# Patient Record
Sex: Female | Born: 1965 | Race: White | Hispanic: No | Marital: Married | Smoking: Former smoker
Health system: Southern US, Community
[De-identification: ages and names within clinical notes are randomized; demographics above are authoritative.]

## PROBLEM LIST (undated history)

## (undated) DIAGNOSIS — F419 Anxiety disorder, unspecified: Secondary | ICD-10-CM

## (undated) DIAGNOSIS — R011 Cardiac murmur, unspecified: Secondary | ICD-10-CM

## (undated) DIAGNOSIS — K589 Irritable bowel syndrome without diarrhea: Secondary | ICD-10-CM

## (undated) DIAGNOSIS — E119 Type 2 diabetes mellitus without complications: Secondary | ICD-10-CM

## (undated) DIAGNOSIS — I10 Essential (primary) hypertension: Secondary | ICD-10-CM

## (undated) DIAGNOSIS — J189 Pneumonia, unspecified organism: Secondary | ICD-10-CM

## (undated) DIAGNOSIS — C539 Malignant neoplasm of cervix uteri, unspecified: Secondary | ICD-10-CM

## (undated) DIAGNOSIS — E785 Hyperlipidemia, unspecified: Secondary | ICD-10-CM

## (undated) DIAGNOSIS — G35 Multiple sclerosis: Secondary | ICD-10-CM

## (undated) DIAGNOSIS — Z8719 Personal history of other diseases of the digestive system: Secondary | ICD-10-CM

## (undated) DIAGNOSIS — K219 Gastro-esophageal reflux disease without esophagitis: Secondary | ICD-10-CM

## (undated) DIAGNOSIS — K5792 Diverticulitis of intestine, part unspecified, without perforation or abscess without bleeding: Secondary | ICD-10-CM

## (undated) DIAGNOSIS — Z9889 Other specified postprocedural states: Secondary | ICD-10-CM

## (undated) DIAGNOSIS — I499 Cardiac arrhythmia, unspecified: Secondary | ICD-10-CM

## (undated) HISTORY — DX: Cardiac murmur, unspecified: R01.1

## (undated) HISTORY — PX: APPENDECTOMY: SHX54

## (undated) HISTORY — PX: OTHER SURGICAL HISTORY: SHX169

## (undated) HISTORY — DX: Multiple sclerosis: G35

## (undated) HISTORY — DX: Personal history of other diseases of the digestive system: Z87.19

## (undated) HISTORY — DX: Malignant neoplasm of cervix uteri, unspecified: C53.9

## (undated) HISTORY — DX: Pneumonia, unspecified organism: J18.9

## (undated) HISTORY — DX: Type 2 diabetes mellitus without complications: E11.9

## (undated) HISTORY — DX: Other specified postprocedural states: Z98.890

## (undated) HISTORY — DX: Irritable bowel syndrome, unspecified: K58.9

## (undated) HISTORY — DX: Anxiety disorder, unspecified: F41.9

## (undated) HISTORY — DX: Diverticulitis of intestine, part unspecified, without perforation or abscess without bleeding: K57.92

## (undated) HISTORY — DX: Hyperlipidemia, unspecified: E78.5

## (undated) HISTORY — DX: Gastro-esophageal reflux disease without esophagitis: K21.9

---

## 1990-06-06 DIAGNOSIS — C539 Malignant neoplasm of cervix uteri, unspecified: Secondary | ICD-10-CM

## 1990-06-06 HISTORY — DX: Malignant neoplasm of cervix uteri, unspecified: C53.9

## 2006-06-06 DIAGNOSIS — J189 Pneumonia, unspecified organism: Secondary | ICD-10-CM

## 2006-06-06 HISTORY — DX: Pneumonia, unspecified organism: J18.9

## 2010-04-15 ENCOUNTER — Emergency Department: Payer: Self-pay | Admitting: Emergency Medicine

## 2010-07-26 ENCOUNTER — Emergency Department (HOSPITAL_COMMUNITY)
Admission: EM | Admit: 2010-07-26 | Discharge: 2010-07-26 | Disposition: A | Discharge status: Home / Self Care | Payer: Self-pay | Attending: Emergency Medicine | Admitting: Emergency Medicine

## 2010-07-26 ENCOUNTER — Emergency Department (HOSPITAL_COMMUNITY): Payer: Self-pay

## 2010-07-26 DIAGNOSIS — S8010XA Contusion of unspecified lower leg, initial encounter: Secondary | ICD-10-CM | POA: Insufficient documentation

## 2010-07-26 DIAGNOSIS — M79609 Pain in unspecified limb: Secondary | ICD-10-CM | POA: Insufficient documentation

## 2010-07-26 DIAGNOSIS — R110 Nausea: Secondary | ICD-10-CM | POA: Insufficient documentation

## 2010-07-26 DIAGNOSIS — R209 Unspecified disturbances of skin sensation: Secondary | ICD-10-CM | POA: Insufficient documentation

## 2010-07-26 DIAGNOSIS — R42 Dizziness and giddiness: Secondary | ICD-10-CM | POA: Insufficient documentation

## 2010-07-26 DIAGNOSIS — M25439 Effusion, unspecified wrist: Secondary | ICD-10-CM | POA: Insufficient documentation

## 2010-07-26 DIAGNOSIS — S63509A Unspecified sprain of unspecified wrist, initial encounter: Secondary | ICD-10-CM | POA: Insufficient documentation

## 2010-07-26 DIAGNOSIS — W010XXA Fall on same level from slipping, tripping and stumbling without subsequent striking against object, initial encounter: Secondary | ICD-10-CM | POA: Insufficient documentation

## 2010-07-26 DIAGNOSIS — Y929 Unspecified place or not applicable: Secondary | ICD-10-CM | POA: Insufficient documentation

## 2011-02-11 ENCOUNTER — Other Ambulatory Visit: Payer: Self-pay | Admitting: Family Medicine

## 2011-02-11 DIAGNOSIS — Z1231 Encounter for screening mammogram for malignant neoplasm of breast: Secondary | ICD-10-CM

## 2011-02-16 ENCOUNTER — Ambulatory Visit: Payer: Self-pay

## 2011-03-16 ENCOUNTER — Ambulatory Visit: Payer: Self-pay

## 2011-05-11 ENCOUNTER — Other Ambulatory Visit (HOSPITAL_COMMUNITY): Payer: Self-pay | Admitting: *Deleted

## 2011-06-10 ENCOUNTER — Ambulatory Visit: Payer: Self-pay

## 2011-09-23 ENCOUNTER — Emergency Department: Payer: Self-pay | Admitting: *Deleted

## 2012-06-26 ENCOUNTER — Emergency Department (HOSPITAL_COMMUNITY)
Admission: EM | Admit: 2012-06-26 | Discharge: 2012-06-26 | Disposition: A | Discharge status: Home / Self Care | Payer: Self-pay | Attending: Emergency Medicine | Admitting: Emergency Medicine

## 2012-06-26 ENCOUNTER — Emergency Department (HOSPITAL_COMMUNITY): Payer: Self-pay

## 2012-06-26 ENCOUNTER — Encounter (HOSPITAL_COMMUNITY): Payer: Self-pay | Admitting: *Deleted

## 2012-06-26 DIAGNOSIS — M25519 Pain in unspecified shoulder: Secondary | ICD-10-CM | POA: Insufficient documentation

## 2012-06-26 DIAGNOSIS — M25512 Pain in left shoulder: Secondary | ICD-10-CM

## 2012-06-26 DIAGNOSIS — I10 Essential (primary) hypertension: Secondary | ICD-10-CM | POA: Insufficient documentation

## 2012-06-26 DIAGNOSIS — Z8679 Personal history of other diseases of the circulatory system: Secondary | ICD-10-CM | POA: Insufficient documentation

## 2012-06-26 HISTORY — DX: Cardiac arrhythmia, unspecified: I49.9

## 2012-06-26 HISTORY — DX: Essential (primary) hypertension: I10

## 2012-06-26 MED ORDER — NAPROXEN 500 MG PO TABS: 500.0000 mg | ORAL_TABLET | Freq: Two times a day (BID) | ORAL | Status: DC

## 2012-06-26 MED ORDER — HYDROCODONE-ACETAMINOPHEN 5-325 MG PO TABS: 2.0000 | ORAL_TABLET | Freq: Four times a day (QID) | ORAL | Status: DC | PRN

## 2012-06-26 MED ORDER — IBUPROFEN 800 MG PO TABS
800.0000 mg | ORAL_TABLET | Freq: Once | ORAL | Status: AC
  Administered 2012-06-26: 800 mg via ORAL
  Filled 2012-06-26: qty 1

## 2012-06-26 NOTE — ED Provider Notes (Signed)
History   This chart was scribed for non-physician practitioner working with Raeford Razor, MD by Smitty Pluck. This patient was seen in room TR04C/TR04C and the patient's care was started at 8:57 PM.   CSN: [id number]  Arrival date & time 06/26/12  4098      Chief Complaint  Patient presents with  . Shoulder Pain    (Consider location/radiation/quality/duration/timing/severity/associated sxs/prior treatment) Patient is a 47 y.o. female presenting with shoulder pain. The history is provided by the patient. No language interpreter was used.  Shoulder Pain This is a chronic problem. The current episode started more than 1 week ago. The problem occurs constantly. The problem has not changed since onset.Pertinent negatives include no shortness of breath. Nothing relieves the symptoms. She has tried nothing for the symptoms.   Stephanie Webb is a 47 y.o. female who presents to the Emergency Department complaining of moderate left shoulder pain that has been ongoing since September 2013. Pt reports that she fell on her left shoulder in April 2013 but did not have moderate pain until September 2013. Pt reports that she has seen orthopedist for same symptoms and was told she did not have a dislocation or fracture. She reports that she is unable to raise her left arm over her head and that movement of left shoulder aggravates the pain. Pt denies fever, chills, nausea, vomiting, diarrhea, weakness, cough, SOB and any other pain.   Orthopedist in [place]  Past Medical History  Diagnosis Date  . Hypertension   . Irregular heart beat     History reviewed. No pertinent past surgical history.  History reviewed. No pertinent family history.  History  Substance Use Topics  . Smoking status: Never Smoker   . Smokeless tobacco: Not on file  . Alcohol Use: No    OB History    Grav Para Term Preterm Abortions TAB SAB Ect Mult Living                  Review of Systems  Constitutional:  Negative for fever and chills.  Respiratory: Negative for cough and shortness of breath.   Gastrointestinal: Negative for nausea, vomiting and diarrhea.  Musculoskeletal: Positive for arthralgias.  Neurological: Negative for weakness.  All other systems reviewed and are negative.    Allergies  Review of patient's allergies indicates no known allergies.  Home Medications   Current Outpatient Rx  Name  Route  Sig  Dispense  Refill  . METOPROLOL SUCCINATE ER 50 MG PO TB24   Oral   Take 50 mg by mouth every evening. Take with or immediately following a meal.           BP 140/92  Pulse 92  Temp 98 F (36.7 C) (Oral)  Resp 16  SpO2 98%  LMP 06/15/2012  Physical Exam  Nursing note and vitals reviewed. Constitutional: She is oriented to person, place, and time. She appears well-developed and well-nourished. No distress.  HENT:  Head: Normocephalic and atraumatic.  Eyes: Conjunctivae normal and EOM are normal.  Neck: Neck supple. No tracheal deviation present.  Cardiovascular: Normal rate, regular rhythm, normal heart sounds and intact distal pulses.   Pulmonary/Chest: Effort normal and breath sounds normal. No respiratory distress. She has no wheezes.  Musculoskeletal:       Limited ROM of left shoulder Left shoulder is tender to palpation Sensation of left arm intact distally   Neurological: She is alert and oriented to person, place, and time.  Skin: Skin is warm and  dry.  Psychiatric: She has a normal mood and affect. Her behavior is normal.    ED Course  Procedures (including critical care time) DIAGNOSTIC STUDIES: Oxygen Saturation is 98% on room air, normal by my interpretation.    COORDINATION OF CARE: 9:02 PM Discussed ED treatment with pt and pt agrees.     Labs Reviewed - No data to display Dg Shoulder Left  06/26/2012  *RADIOLOGY REPORT*  Clinical Data: Status post fall 8 months ago.  Shoulder pain for 4 months.  Limited range of motion.  LEFT SHOULDER  - 2+ VIEW  Comparison: None.  Findings: The humerus is located and the acromioclavicular joint is intact.  There is no notable degenerative disease about the shoulder.  Imaged left lung and ribs are unremarkable.  IMPRESSION: Negative exam.   Original Report Authenticated By: Holley Dexter, M.D.      No diagnosis found.  Patient with inability to extend arm actively or passively beyond ninety degrees without significant pain.  Radiology results from today reviewed and shared with patient.  Suspect frozen shoulder.  Placed in sling, orthopedic referral.  MDM     I personally performed the services described in this documentation, which was scribed in my presence. The recorded information has been reviewed and is accurate.      Jimmye Norman, NP 06/26/12 2351

## 2012-06-26 NOTE — ED Notes (Signed)
Pt states that she fell in April, was seen at [facility] and then since September she has been having decreased mobility and burning sensations. Pt states she is also hearing popping when she attempts to move her shoulder and then she can move a little further. Pt states she can't sleep, her arm goes numb, pt states also painful. Pt last x ray was in April. Pt able to lift arm straight in front of her and to the side half way up, pt has not had her rotator cuff checked. Pt states it is pen point specific pain in different areas when she moves. Pt cap refill less than 3, pt able to wiggle fingers.

## 2012-06-26 NOTE — Progress Notes (Signed)
Orthopedic Tech Progress Note Patient Details:  Stephanie Webb Oct 20, 1965 [id number]  Ortho Devices Type of Ortho Device: Arm sling Ortho Device/Splint Location: (L) UE Ortho Device/Splint Interventions: Application   [name] 06/26/2012, 9:06 PM

## 2012-06-29 NOTE — ED Provider Notes (Signed)
Medical screening examination/treatment/procedure(s) were performed by non-physician practitioner and as supervising physician I was immediately available for consultation/collaboration.  Shamir Sedlar, MD 06/29/12 0740 

## 2013-08-27 ENCOUNTER — Ambulatory Visit (INDEPENDENT_AMBULATORY_CARE_PROVIDER_SITE_OTHER): Payer: 59 | Admitting: Neurology

## 2013-08-27 ENCOUNTER — Encounter: Payer: Self-pay | Admitting: Neurology

## 2013-08-27 VITALS — BP 128/84 | HR 84 | Ht 65.35 in | Wt 227.0 lb

## 2013-08-27 DIAGNOSIS — R209 Unspecified disturbances of skin sensation: Secondary | ICD-10-CM

## 2013-08-27 DIAGNOSIS — R51 Headache: Secondary | ICD-10-CM

## 2013-08-27 DIAGNOSIS — R5383 Other fatigue: Secondary | ICD-10-CM

## 2013-08-27 DIAGNOSIS — H539 Unspecified visual disturbance: Secondary | ICD-10-CM

## 2013-08-27 DIAGNOSIS — R5381 Other malaise: Secondary | ICD-10-CM

## 2013-08-27 DIAGNOSIS — R202 Paresthesia of skin: Secondary | ICD-10-CM

## 2013-08-27 NOTE — Progress Notes (Signed)
[facility] Clinic Note - Initial Visit   Date: 08/27/2013    Chrystal Zeimet MRN: [id number] DOB: 1965/06/07   Dear Dr Tobin Chad:  Thank you for your kind referral of Amily Depp for consultation of numbness/tingling of the several neurological complaints. Although her history is well known to you, please allow [place] to reiterate it for the purpose of our medical record. The patient was accompanied to the clinic by self.   History of Present Illness: Stephanie Webb is a 48 y.o. right-handed Caucasian female with history of hypertension, GERD, irritable bowel syndrome, and palpitations presenting for evaluation of vision changes, fatigue, and generalized numbness/tingling.    She feels that she has multiple sclerosis because she complains of 20-year history of transient bilateral vision loss, double vision, generalized fatigue, numbness/tingling of the arms > legs, and muscle aches.  All of her symptoms are fluctuating and occur for several days at a time about 3-4 times per year. She does not feel depressed.  In July 2014, she says she had an episode of bilateral leg numbness and was unable to lift her legs.  She did not go to the ED, but massaged her legs which resolved symptoms.  She denies any provoking factors or alleviates. She reports having dilated eye exams which has been normal.  She also reports having unrefreshed sleep, mild snoring, and waking up several times at night.     Out-side paper records, electronic medical record, and images have been reviewed where available and summarized as:  Labs 08/08/2013:  WBC 7.7, Hgb 14.2, PLT 220, Na 140, K 4.6, Cr 0.66, AST 25, ALT 43*, TSH 1.6, ESR 3, vitamin B12 553   Past Medical History  Diagnosis Date  . Hypertension   . Irregular heart beat   . GERD (gastroesophageal reflux disease)     Past Surgical History  Procedure Laterality Date  . Cesarean section    . Ovary removal       Medications:    Current Outpatient Prescriptions on File Prior to Visit  Medication Sig Dispense Refill  . metoprolol succinate (TOPROL-XL) 50 MG 24 hr tablet Take 50 mg by mouth every evening. Take with or immediately following a meal.       No current facility-administered medications on file prior to visit.    Allergies: No Known Allergies  Family History: Family History  Problem Relation Age of Onset  . Hypertension Mother   . Heart disease Maternal Grandfather   . Cancer Father     Deceased  . Healthy Brother     Social History: History   Social History  . Marital Status: Legally Separated    Spouse Name: N/A    Number of Children: 4  . Years of Education: N/A   Occupational History  .  [employer]   Social History Main Topics  . Smoking status: Never Smoker   . Smokeless tobacco: Not on file  . Alcohol Use: Yes     Comment: Occasional   . 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 Sexual Activity: Not on file   Other Topics Concern  . Not on file   Social History Narrative   She works at [employer].   She lives with husband and two children. She has two grown children living outside of the home.   Highest level of education:  Associates degree    Review of Systems:  CONSTITUTIONAL: No  fevers, chills, night sweats, or weight loss.   EYES: + visual changes or eye pain ENT: No hearing changes.  No history of nose bleeds.   RESPIRATORY: No cough, wheezing and shortness of breath.   CARDIOVASCULAR: Negative for chest pain, and palpitations.   GI: Negative for abdominal discomfort, blood in stools or black stools.  +recent change in bowel habits.   GU:  No history of incontinence.   MUSCLOSKELETAL: No history of joint pain or swelling.  +myalgias.   SKIN: Negative for lesions, rash, and itching.   HEMATOLOGY/ONCOLOGY: Negative for prolonged bleeding, bruising easily, and swollen nodes.  No history of cancer.   ENDOCRINE:  Negative for cold or heat intolerance, polydipsia or goiter.   PSYCH:  No depression or anxiety symptoms.   NEURO: As Above.   Vital Signs:  BP 128/84  Pulse 84  Ht 5' 5.35" (1.66 m)  Wt 227 lb (102.967 kg)  BMI 37.37 kg/m2  SpO2 95% Pain Scale: 0 on a scale of 0-10   General Medical Exam:   General:  Well appearing, comfortable.   Eyes/ENT: see cranial nerve examination.   Neck: No masses appreciated.  Full range of motion without tenderness.  No carotid bruits. Respiratory:  Clear to auscultation, good air entry bilaterally.   Cardiac:  Regular rate and rhythm, no murmur.   Back:  No pain to palpation of spinous processes.   Extremities:  No deformities, edema, or skin discoloration. Good capillary refill.   Skin:  Skin color, texture, turgor normal. No rashes or lesions.  Neurological Exam: MENTAL STATUS including orientation to time, place, person, recent and remote memory, attention span and concentration, language, and fund of knowledge is normal.  Speech is not dysarthric.  CRANIAL NERVES: II:  No visual field defects.  Unremarkable fundi.   III-IV-VI: Pupils equal round and reactive to light.  Normal conjugate, extra-ocular eye movements in all directions of gaze.  No nystagmus.  No ptosis.   V:  Normal facial sensation.  Jaw jerk is absent  VII:  Normal facial symmetry and movements.  No pathologic facial reflexes.  VIII:  Normal hearing and vestibular function.   IX-X:  Normal palatal movement.   XI:  Normal shoulder shrug and head rotation.   XII:  Normal tongue strength and range of motion, no deviation or fasciculation.  MOTOR:  No atrophy, fasciculations or abnormal movements.  No pronator drift.  Tone is normal.    Right Upper Extremity:    Left Upper Extremity:    Deltoid  5/5   Deltoid  5/5   Biceps  5/5   Biceps  5/5   Triceps  5/5   Triceps  5/5   Wrist extensors  5/5   Wrist extensors  5/5   Wrist flexors  5/5   Wrist flexors  5/5   Finger extensors   5/5   Finger extensors  5/5   Finger flexors  5/5   Finger flexors  5/5   Dorsal interossei  5/5   Dorsal interossei  5/5   Abductor pollicis  5/5   Abductor pollicis  5/5   Tone (Ashworth scale)  0  Tone (Ashworth scale)  0   Right Lower Extremity:    Left Lower Extremity:    Hip flexors  5/5   Hip flexors  5/5   Hip extensors  5/5   Hip extensors  5/5   Knee flexors  5/5   Knee flexors  5/5   Knee extensors  5/5   Knee extensors  5/5   Dorsiflexors  5/5   Dorsiflexors  5/5   Plantarflexors  5/5   Plantarflexors  5/5   Toe extensors  5/5   Toe extensors  5/5   Toe flexors  5/5   Toe flexors  5/5   Tone (Ashworth scale)  0  Tone (Ashworth scale)  0   MSRs:  Right                                                                 Left brachioradialis 2+  brachioradialis 2+  biceps 2+  biceps 2+  triceps 2+  triceps 2+  patellar 2+  patellar 2+  ankle jerk 2+  ankle jerk 2+  Hoffman no  Hoffman no  plantar response down  plantar response down   SENSORY:  Normal and symmetric perception of light touch, pinprick, vibration, and proprioception.  Romberg's sign absent.   COORDINATION/GAIT: Normal finger-to- nose-finger and heel-to-shin.  Intact rapid alternating movements bilaterally.  Able to rise from a chair without using arms.  Gait narrow based and stable. Tandem and stressed gait intact.    IMPRESSION/PLAN: Ms. Omura is a 48 year-old female presenting with a multitude of complaints (paresthesias, vision changes, ringing in the ear, myalgias, generalized fatigue and highly concerned that she has multiple sclerosis.  I ressured her that given her normal exam, my suspicion for demyelinating disease is very low. For completeness, MRI brain will be checked.  Her labs including TSH and vitamin B12 are normal.  I will also check ANA.  She reports having fragmented sleep patterns and I wonder if some of her fatigue is coming from poor sleep habits and/or OSA but it would not necessarily  explain her other somatic symptoms.  Sleep study can be considered going forward, if results of imaging are normal  I will see her back in 1-16months.   The duration of this appointment visit was 45 minutes of face-to-face time with the patient.  Greater than 50% of this time was spent in counseling, explanation of diagnosis, planning of further management, and coordination of care.   Thank you for allowing me to participate in patient's care.  If I can answer any additional questions, I would be pleased to do so.    Sincerely,    Shahida Schnackenberg K. Posey Pronto, DO

## 2013-08-28 DIAGNOSIS — R5381 Other malaise: Secondary | ICD-10-CM | POA: Insufficient documentation

## 2013-08-28 DIAGNOSIS — R5383 Other fatigue: Secondary | ICD-10-CM

## 2013-08-28 DIAGNOSIS — R202 Paresthesia of skin: Secondary | ICD-10-CM | POA: Insufficient documentation

## 2013-08-28 LAB — ANA: Anti Nuclear Antibody(ANA): NEGATIVE

## 2013-09-03 ENCOUNTER — Other Ambulatory Visit: Payer: 59

## 2013-09-05 ENCOUNTER — Ambulatory Visit
Admission: RE | Admit: 2013-09-05 | Discharge: 2013-09-05 | Disposition: A | Discharge status: Home / Self Care | Payer: Medicaid Other | Source: Ambulatory Visit | Attending: Neurology | Admitting: Neurology

## 2013-09-05 ENCOUNTER — Inpatient Hospital Stay: Admission: RE | Admit: 2013-09-05 | Payer: 59 | Source: Ambulatory Visit

## 2013-09-05 DIAGNOSIS — R51 Headache: Secondary | ICD-10-CM

## 2013-09-05 DIAGNOSIS — R202 Paresthesia of skin: Secondary | ICD-10-CM

## 2013-09-05 DIAGNOSIS — R5381 Other malaise: Secondary | ICD-10-CM

## 2013-09-05 DIAGNOSIS — H539 Unspecified visual disturbance: Secondary | ICD-10-CM

## 2013-09-05 MED ORDER — GADOBENATE DIMEGLUMINE 529 MG/ML IV SOLN
20.0000 mL | Freq: Once | INTRAVENOUS | Status: AC | PRN
  Administered 2013-09-05: 20 mL via INTRAVENOUS

## 2013-09-09 ENCOUNTER — Other Ambulatory Visit: Payer: Self-pay | Admitting: *Deleted

## 2013-09-09 ENCOUNTER — Telehealth: Payer: Self-pay | Admitting: Neurology

## 2013-09-09 DIAGNOSIS — G379 Demyelinating disease of central nervous system, unspecified: Secondary | ICD-10-CM

## 2013-09-09 DIAGNOSIS — R9089 Other abnormal findings on diagnostic imaging of central nervous system: Secondary | ICD-10-CM

## 2013-09-09 DIAGNOSIS — IMO0002 Reserved for concepts with insufficient information to code with codable children: Secondary | ICD-10-CM

## 2013-09-09 NOTE — Telephone Encounter (Signed)
Received a call from patient to discuss MRI results. There is evidence of white matter changes involving the cortical and subcortical region which can be seen in demyelinating disease. I would like to followup with an MRI of the cervical spine to determine disease burden. I discussed going forward, lumbar puncture may be needed for more definitive diagnosis. There is no areas of enhancement suggesting active disease. All questions were answered to the best of my ability.  Stephanie Arbuthnot K. Posey Pronto, DO

## 2013-09-09 NOTE — Telephone Encounter (Signed)
I attempted to contact patient via phone today regarding the results of MRI brain, however there was no answer and there was no option to leave a voicemail. I tried emergency contact on file, her daughter, who also did not answer so message was left to ask her mother to return my call.  Alija Riano K. Posey Pronto, DO

## 2013-09-09 NOTE — Telephone Encounter (Signed)
MRI scheduled for Monday, April 13th at 6:45 pm.  Patient notified of appt time and date.

## 2013-09-13 ENCOUNTER — Ambulatory Visit
Admission: RE | Admit: 2013-09-13 | Discharge: 2013-09-13 | Disposition: A | Discharge status: Home / Self Care | Payer: Medicaid Other | Source: Ambulatory Visit | Attending: Neurology | Admitting: Neurology

## 2013-09-13 DIAGNOSIS — IMO0002 Reserved for concepts with insufficient information to code with codable children: Secondary | ICD-10-CM

## 2013-09-13 DIAGNOSIS — G379 Demyelinating disease of central nervous system, unspecified: Secondary | ICD-10-CM

## 2013-09-13 MED ORDER — GADOBENATE DIMEGLUMINE 529 MG/ML IV SOLN
20.0000 mL | Freq: Once | INTRAVENOUS | Status: AC | PRN
  Administered 2013-09-13: 20 mL via INTRAVENOUS

## 2013-09-16 ENCOUNTER — Telehealth: Payer: Self-pay | Admitting: Neurology

## 2013-09-16 ENCOUNTER — Other Ambulatory Visit: Payer: 59

## 2013-09-16 NOTE — Telephone Encounter (Signed)
Results of the MRI cervical spine discussed with patient. There is no evidence of cervical cord abnormalities. I recommend we move forward with the spinal tap to look for inflammatory changes of the CSF. Patient would like to discuss this with her husband and will let us know if she decides to proceed forward.  Tamecca Artiga K. Posey Pronto, DO

## 2013-09-17 NOTE — Telephone Encounter (Signed)
Patient would like to hold on CSF testing at this time. She will contact us more information if she would either like a second opinion or pursue testing at a later date.  Stephanie Craker K. Posey Pronto, DO

## 2013-09-17 NOTE — Telephone Encounter (Signed)
Called patient.  She said that she would like to discuss this with you a little more.  She will be at work until around 12:00 but will be home this afternoon.  Would you like to call her or have me call her back?

## 2013-09-18 ENCOUNTER — Ambulatory Visit (HOSPITAL_COMMUNITY): Payer: 59

## 2013-11-05 ENCOUNTER — Ambulatory Visit: Payer: 59 | Admitting: Neurology

## 2014-01-01 ENCOUNTER — Encounter: Payer: Self-pay | Admitting: Gastroenterology

## 2014-01-28 ENCOUNTER — Ambulatory Visit (INDEPENDENT_AMBULATORY_CARE_PROVIDER_SITE_OTHER): Payer: Medicaid Other | Admitting: Neurology

## 2014-01-28 ENCOUNTER — Encounter: Payer: Self-pay | Admitting: Neurology

## 2014-01-28 VITALS — BP 120/84 | HR 78 | Ht 65.0 in | Wt 212.4 lb

## 2014-01-28 DIAGNOSIS — G379 Demyelinating disease of central nervous system, unspecified: Secondary | ICD-10-CM

## 2014-01-28 NOTE — Patient Instructions (Signed)
1.  Start vitamin D 2000 units daily 2.  We will contact you with your appointment time and date for lumbar puncture 3.  Return to clinic in 6 weeks

## 2014-01-28 NOTE — Progress Notes (Signed)
Follow-up Visit   Date: 01/28/2014    Stephanie Webb MRN: [id number] DOB: 10-27-1965   Interim History: Stephanie Webb is a 48 y.o. right-handed Caucasian female with history of hypertension, GERD, irritable bowel syndrome, and palpitations returning to the clinic for follow-up of a constellation of neurological symptoms.  She was last seen in the clinic on 08/27/2013.  History of present illness: She feels that she has multiple sclerosis because she complains of 20-year history of transient bilateral vision loss, double vision, generalized fatigue, numbness/tingling of the arms > legs, and muscle aches. All of her symptoms are fluctuating and occur for several days at a time about 3-4 times per year. She does not feel depressed. In July 2014, she says she had an episode of bilateral leg numbness and was unable to lift her legs. She did not go to the ED, but massaged her legs which resolved symptoms. Dilated eye exams has been normal.  She also reports having unrefreshed sleep, mild snoring, and waking up several times at night.   UPDATE 01/28/3014: Patient underwent MRI brain which showed white matter changes involving the cerebral hemisphere and upper cervical cord, concerning for demyelinating disease.  No active disease.  Patient did not want to have CSF testing earlier this year, but is no agreeable to testing.  She continues to have intermittent symptoms of vision changes, fatigue, and sensory changes.  She is currently being worked-up for possible diverticulosis.  She had episode of vertigo lasting 10-min while visiting [place] in July.     Medications:  Current Outpatient Prescriptions on File Prior to Visit  Medication Sig Dispense Refill  . metoprolol succinate (TOPROL-XL) 50 MG 24 hr tablet Take 50 mg by mouth every evening. Take with or immediately following a meal.       No current facility-administered medications on file prior to visit.    Allergies: No Known  Allergies   Review of Systems:  CONSTITUTIONAL: No fevers, chills, night sweats, or weight loss.   EYES: No visual changes or eye pain ENT: No hearing changes.  No history of nose bleeds.   RESPIRATORY: No cough, wheezing and shortness of breath.   CARDIOVASCULAR: Negative for chest pain, and palpitations.   GI: Negative for abdominal discomfort, blood in stools or black stools.  No recent change in bowel habits.   GU:  No history of incontinence.   MUSCLOSKELETAL:+ history of joint pain or swelling.  +myalgias.   SKIN: Negative for lesions, rash, and itching.   ENDOCRINE: Negative for cold or heat intolerance, polydipsia or goiter.   PSYCH: No  depression or anxiety symptoms.   NEURO: As Above.   Vital Signs:  BP 120/84  Pulse 78  Ht $R'5\' 5"'UB$  (1.651 m)  Wt 212 lb 6 oz (96.333 kg)  BMI 35.34 kg/m2  SpO2 97%  Neurological Exam: MENTAL STATUS including orientation to time, place, person, recent and remote memory, attention span and concentration, language, and fund of knowledge is normal.  Speech is not dysarthric.  CRANIAL NERVES: No visual field defects. Pupils equal round and reactive to light.  Normal conjugate, extra-ocular eye movements in all directions of gaze.  No ptosis. Face is symmetric. Palate elevates symmetrically.  Tongue is midline.  MOTOR:  Motor strength is 5/5 in all extremities.  No pronator drift.  Tone is normal.    MSRs:  Reflexes are 2+/4 throughout.  Plantar responses are down going.  SENSORY:  Intact to light touch and vibration.  COORDINATION/GAIT:  Normal  finger-to- nose-finger and heel-to-shin.  Intact rapid alternating movements bilaterally.  Gait narrow based and stable.   Data: MRI brain wwo contrast 09/05/2013: Multiple foci of white matter T2 signal abnormality in both cerebral hemispheres and in the upper cervical spinal cord. Findings are nonspecific but most concerning for demyelinating disease. No abnormal enhancement to suggest active  demyelination.   MRI cervical spine wwo contrast 09/14/2013: 1. Cervical MRI fails to confirm definite abnormal signal or enhancement within the cervical cord.  2. Small disc protrusions at C5-6 and C6-7 without cord deformity or foraminal compromise.  Labs 08/08/2013: WBC 7.7, Hgb 14.2, PLT 220, Na 140, K 4.6, Cr 0.66, AST 25, ALT 43*, TSH 1.6, ESR 3, vitamin B12 553  IMPRESSION: Stephanie Webb is a 73 year-old female presenting with a constellation of complaints (paresthesias, vision changes, ringing in the ear, myalgias, generalized fatigue) whose recent MRI briain showed white matter changes concerning for demyelinating disease.  I had previously discussed proceeding with lumbar puncture, however patient had declined, but is agreeable to testing now.  I explained that if CSF studies are suggestive of inflammatory changes, will discuss immunomodulatory therapy going forward.    PLAN/RECOMMENDATIONS:  1.  Start vitamin D 2000units 2.  Lumbar puncture - check CSF cell count and diff, protein, glucose, routine cultures, fungal cultures, IgG index, oligoclonal bands, myelin basic protein, flow cytology, flow cytometry (cytospin), ACE 3.  Return to clinic in 6 weeks   The duration of this appointment visit was 30 minutes of face-to-face time with the patient.  Greater than 50% of this time was spent in counseling, explanation of diagnosis, planning of further management, and coordination of care.   Thank you for allowing me to participate in patient's care.  If I can answer any additional questions, I would be pleased to do so.    Sincerely,    Lanya Bucks K. Posey Pronto, DO

## 2014-01-29 ENCOUNTER — Other Ambulatory Visit: Payer: Self-pay | Admitting: *Deleted

## 2014-01-29 DIAGNOSIS — G379 Demyelinating disease of central nervous system, unspecified: Secondary | ICD-10-CM

## 2014-01-29 NOTE — Progress Notes (Signed)
The order has been placed.  I'm just waiting for Danielle to call me back to schedule the appointment.

## 2014-02-05 ENCOUNTER — Other Ambulatory Visit: Payer: Medicaid Other

## 2014-02-06 ENCOUNTER — Other Ambulatory Visit: Payer: Medicaid Other

## 2014-02-20 ENCOUNTER — Other Ambulatory Visit: Payer: Self-pay | Admitting: *Deleted

## 2014-02-20 ENCOUNTER — Ambulatory Visit
Admission: RE | Admit: 2014-02-20 | Discharge: 2014-02-20 | Disposition: A | Discharge status: Home / Self Care | Payer: Medicaid Other | Source: Ambulatory Visit | Attending: Neurology | Admitting: Neurology

## 2014-02-20 ENCOUNTER — Other Ambulatory Visit (HOSPITAL_COMMUNITY)
Admission: RE | Admit: 2014-02-20 | Discharge: 2014-02-20 | Disposition: A | Discharge status: Home / Self Care | Payer: Medicaid Other | Source: Ambulatory Visit | Attending: Neurology | Admitting: Neurology

## 2014-02-20 VITALS — BP 138/77 | HR 61

## 2014-02-20 DIAGNOSIS — M791 Myalgia, unspecified site: Secondary | ICD-10-CM

## 2014-02-20 DIAGNOSIS — H539 Unspecified visual disturbance: Secondary | ICD-10-CM

## 2014-02-20 DIAGNOSIS — G379 Demyelinating disease of central nervous system, unspecified: Secondary | ICD-10-CM

## 2014-02-20 DIAGNOSIS — R209 Unspecified disturbances of skin sensation: Secondary | ICD-10-CM | POA: Diagnosis not present

## 2014-02-20 DIAGNOSIS — G35 Multiple sclerosis: Secondary | ICD-10-CM | POA: Diagnosis not present

## 2014-02-20 DIAGNOSIS — R202 Paresthesia of skin: Secondary | ICD-10-CM

## 2014-02-20 LAB — CSF CELL COUNT WITH DIFFERENTIAL
RBC COUNT CSF: 0 /mm3
Tube #: 3
WBC CSF: 3 /mm3 (ref 0–5)

## 2014-02-20 LAB — GLUCOSE, CSF: GLUCOSE CSF: 107 mg/dL — AB (ref 43–76)

## 2014-02-20 LAB — PROTEIN, CSF: Total  Protein, CSF: 26 mg/dL (ref 15–45)

## 2014-02-20 NOTE — Discharge Instructions (Signed)

## 2014-02-20 NOTE — Progress Notes (Signed)
Discharge instructions and work release given to pt's husband.

## 2014-02-20 NOTE — Progress Notes (Signed)
Blood drawn from right AC. 1 sst tube obtained to go with spinal fluid. Site is unremarkable and pt tolerated procedure well. She needs a work release note from Dr. Gerilyn Pilgrim.

## 2014-02-23 LAB — CSF CULTURE: GRAM STAIN: NONE SEEN

## 2014-02-23 LAB — CSF CULTURE W GRAM STAIN: Culture: NO GROWTH

## 2014-02-24 LAB — CSF IGG: IGG CSF: 1.3 mg/dL (ref 0.8–7.7)

## 2014-02-25 LAB — OLIGOCLONAL BANDS, CSF + SERM

## 2014-02-26 LAB — ANGIOTENSIN CONVERTING ENZYME, CSF: ANGIO CONVERT ENZYME: 7 U/L (ref <=15)

## 2014-03-05 ENCOUNTER — Ambulatory Visit (INDEPENDENT_AMBULATORY_CARE_PROVIDER_SITE_OTHER): Payer: Medicaid Other | Admitting: Gastroenterology

## 2014-03-05 ENCOUNTER — Encounter: Payer: Self-pay | Admitting: Gastroenterology

## 2014-03-05 VITALS — BP 118/82 | HR 80 | Ht 65.0 in | Wt 228.2 lb

## 2014-03-05 DIAGNOSIS — K921 Melena: Secondary | ICD-10-CM

## 2014-03-05 DIAGNOSIS — R1319 Other dysphagia: Secondary | ICD-10-CM

## 2014-03-05 DIAGNOSIS — K219 Gastro-esophageal reflux disease without esophagitis: Secondary | ICD-10-CM

## 2014-03-05 DIAGNOSIS — K589 Irritable bowel syndrome without diarrhea: Secondary | ICD-10-CM

## 2014-03-05 MED ORDER — PEG-KCL-NACL-NASULF-NA ASC-C 100 G PO SOLR: 1.0000 | Freq: Once | ORAL | Status: DC

## 2014-03-05 MED ORDER — OMEPRAZOLE 40 MG PO CPDR: 40.0000 mg | DELAYED_RELEASE_CAPSULE | Freq: Two times a day (BID) | ORAL | Status: DC

## 2014-03-05 NOTE — Progress Notes (Signed)
    History of Present Illness: This is a 48 year old female who relates 2 or 3 episodes of bright red blood per rectum that occurred in June and July. She has had no rectal bleeding since then. She has chronic alternating diarrhea and constipation with occasional abdominal pain that has previously been diagnosed as irritable bowel syndrome. She states she has had intermittent difficulty swallowing solids and frequent reflux symptoms. She has a history of GERD and states she had an esophageal stricture that was dilated. She states she was treated with Nexium twice daily years ago and she cannot recall if it was effective in controlling her reflux. She underwent upper endoscopy with dilation and colonoscopy in 2010 in [place]. Denies weight loss, change in stool caliber, melena, nausea, vomiting, chest pain.  Review of Systems: Pertinent positive and negative review of systems were noted in the above HPI section. All other review of systems were otherwise negative.  Current Medications, Allergies, Past Medical History, Past Surgical History, Family History and Social History were reviewed in [employer] record.  Physical Exam: General: Well developed , well nourished, no acute distress Head: Normocephalic and atraumatic Eyes:  sclerae anicteric, EOMI Ears: Normal auditory acuity Mouth: No deformity or lesions Neck: Supple, no masses or thyromegaly Lungs: Clear throughout to auscultation Heart: Regular rate and rhythm; no murmurs, rubs or bruits Abdomen: Soft, non tender and non distended. No masses, hepatosplenomegaly or hernias noted. Normal Bowel sounds Rectal: deferred to colonoscopy Musculoskeletal: Symmetrical with no gross deformities  Skin: No lesions on visible extremities Pulses:  Normal pulses noted Extremities: No clubbing, cyanosis, edema or deformities noted Neurological: Alert oriented x 4, grossly nonfocal Cervical Nodes:  No significant cervical  adenopathy Inguinal Nodes: No significant inguinal adenopathy Psychological:  Alert and cooperative. Normal mood and affect  Assessment and Recommendations:  1. Hematochezia, self-limited. Presumed benign anorectal source. Given that her colonoscopy was 5 years ago we will proceed with colonoscopy to rule out colorectal neoplasms and other disorders. The risks, benefits, and alternatives to colonoscopy with possible biopsy and possible polypectomy were discussed with the patient and they consent to proceed.   2. Chronic alternating diarrhea and constipation with occasional abdominal pain. Presumed IBS. Trial of a daily probiotic. Avoid foods that trigger symptoms.  3. GERD and dysphagia. R/O esophagitis and esophageal stricture. Increase omeprazole to 40 mg po bid and follow all antireflux measures. The risks, benefits, and alternatives to endoscopy with possible biopsy and possible dilation were discussed with the patient and they consent to proceed.

## 2014-03-05 NOTE — Patient Instructions (Addendum)
You have been scheduled for an endoscopy and colonoscopy. Please follow the written instructions given to you at your visit today. Please pick up your prep at the pharmacy within the next 1-3 days. If you use inhalers (even only as needed), please bring them with you on the day of your procedure. Your physician has requested that you go to www.startemmi.com and enter the access code given to you at your visit today. This web site gives a general overview about your procedure. However, you should still follow specific instructions given to you by our office regarding your preparation for the procedure.  Patient advised to avoid spicy, acidic, citrus, chocolate, mints, fruit and fruit juices.  Limit the intake of caffeine, alcohol and Soda.  Don't exercise too soon after eating.  Don't lie down within 3-4 hours of eating.  Elevate the head of your bed.  We have sent the following medications to your pharmacy for you to pick up at your convenience:omeprazole 40 mg one tablet by mouth twice daily.   Thank you for choosing me and [facility].  Pricilla Riffle. Dagoberto Ligas., MD., Marval Regal  cc: Estell Harpin, FNP

## 2014-03-10 ENCOUNTER — Ambulatory Visit: Payer: 59 | Admitting: Gastroenterology

## 2014-03-12 ENCOUNTER — Ambulatory Visit (INDEPENDENT_AMBULATORY_CARE_PROVIDER_SITE_OTHER): Payer: Medicaid Other | Admitting: Neurology

## 2014-03-12 ENCOUNTER — Encounter: Payer: Self-pay | Admitting: Neurology

## 2014-03-12 VITALS — BP 130/88 | HR 74 | Ht 65.0 in | Wt 225.3 lb

## 2014-03-12 DIAGNOSIS — R5381 Other malaise: Secondary | ICD-10-CM

## 2014-03-12 DIAGNOSIS — G4719 Other hypersomnia: Secondary | ICD-10-CM

## 2014-03-12 DIAGNOSIS — G379 Demyelinating disease of central nervous system, unspecified: Secondary | ICD-10-CM

## 2014-03-12 NOTE — Progress Notes (Signed)
Note faxed.

## 2014-03-12 NOTE — Progress Notes (Signed)
Follow-up Visit   Date: 03/12/2014    Stephanie Webb MRN: [id number] DOB: 18-Jul-1965   Interim History: Stephanie Webb is a 48 y.o. right-handed Caucasian female with history of hypertension, GERD, irritable bowel syndrome, and palpitations returning to the clinic for follow-up of generalized fatigue and ?multiple sclerosis.    History of present illness: She feels that she has multiple sclerosis because she complains of 20-year history of transient bilateral vision loss, double vision, generalized fatigue, numbness/tingling of the arms > legs, and muscle aches. All of her symptoms are fluctuating and occur for several days at a time about 3-4 times per year. She does not feel depressed. In July 2014, she says she had an episode of bilateral leg numbness and was unable to lift her legs. She did not go to the ED, but massaged her legs which resolved symptoms. Dilated eye exams has been normal.  She also reports having unrefreshed sleep, mild snoring, and waking up several times at night.   UPDATE 01/28/3014:  Patient underwent MRI brain which showed white matter changes involving the cerebral hemisphere and upper cervical cord, concerning for demyelinating disease.  No active disease.  Patient did not want to have CSF testing earlier this year, but is no agreeable to testing.  She continues to have intermittent symptoms of vision changes, fatigue, and sensory changes.  She is currently being worked-up for possible diverticulosis.  She had episode of vertigo lasting 10-min while visiting [place] in July.    UPDATE 03/12/2014:  Patient has CSF testing which was essentially normal, except elevated CSF glucose.  Particularly, there was no signs of inflammation.  Her primary complaints is ongoing problems with fatigue and excessive daytime sleepiness.  She reports taking a daily nap but still does not feel refreshed. She feels as if she could sleep all the time.  No associated cataplexy or hallucinations.   She says that her daughter has spells of sleep paralysis.   Medications:  Current Outpatient Prescriptions on File Prior to Visit  Medication Sig Dispense Refill  . metoprolol succinate (TOPROL-XL) 50 MG 24 hr tablet Take 50 mg by mouth every evening. Take with or immediately following a meal.      . omeprazole (PRILOSEC) 40 MG capsule Take 1 capsule (40 mg total) by mouth 2 (two) times daily.  60 capsule  5   No current facility-administered medications on file prior to visit.    Allergies: No Known Allergies   Review of Systems:  CONSTITUTIONAL: No fevers, chills, night sweats, or weight loss.   EYES: No visual changes or eye pain ENT: No hearing changes.  No history of nose bleeds.   RESPIRATORY: No cough, wheezing and shortness of breath.   CARDIOVASCULAR: Negative for chest pain, and palpitations.   GI: Negative for abdominal discomfort, blood in stools or black stools.  No recent change in bowel habits.   GU:  No history of incontinence.   MUSCLOSKELETAL:+ history of joint pain or swelling.  +myalgias.   SKIN: Negative for lesions, rash, and itching.   ENDOCRINE: Negative for cold or heat intolerance, polydipsia or goiter.   PSYCH: No  depression or anxiety symptoms.   NEURO: As Above.   Vital Signs:  BP 130/88  Pulse 74  Ht _0  (1.651 m)  Wt 225 lb 5 oz (102.201 kg)  BMI 37.49 kg/m2  SpO2 97%  LMP 03/05/2014  Epworth score:  - Sitting and reading  3  - watching TV  1 - inactive  public place  1 - sitting in car   3  - laying down to rest  3 - sitting after lunch  3  - in car    1    Total: 15    Neurological Exam: MENTAL STATUS including orientation to time, place, person, recent and remote memory, attention span and concentration, language, and fund of knowledge is normal.  Speech is not dysarthric.  CRANIAL NERVES:  Pupils equal round and reactive to light.  Normal conjugate, extra-ocular eye movements in all directions of gaze.  No ptosis. Face is  symmetric. Palate elevates symmetrically.  Tongue is midline.  MOTOR:  Motor strength is 5/5 in all extremities.  No pronator drift.  Tone is normal.    MSRs:  Reflexes are 2+/4 throughout.   SENSORY:  Intact to light touch and vibration.  COORDINATION/GAIT:   Gait narrow based and stable.   Data: MRI brain wwo contrast 09/05/2013: Multiple foci of white matter T2 signal abnormality in both cerebral hemispheres and in the upper cervical spinal cord. Findings are nonspecific but most concerning for demyelinating disease. No abnormal enhancement to suggest active demyelination.   MRI cervical spine wwo contrast 09/14/2013: 1. Cervical MRI fails to confirm definite abnormal signal or enhancement within the cervical cord.  2. Small disc protrusions at C5-6 and C6-7 without cord deformity or foraminal compromise.  Labs 08/08/2013: WBC 7.7, Hgb 14.2, PLT 220, Na 140, K 4.6, Cr 0.66, AST 25, ALT 43*, TSH 1.6, ESR 3, vitamin B12 553, ANA neg  CSF 02/20/2014:  R0 W3 G107* P26  IgG 1.3, oligoclonal bands absent, ACE 7, cytology negative   IMPRESSION: Stephanie Webb is a 48 year-old female returning for evaluation of a constellation of complaints (paresthesias, vision changes, ringing in the ear, myalgias, generalized fatigue) whose MRI briain showed white matter changes concerning for demyelinating disease. CSF testing, however, did not support diagnosis of multiple sclerosis, and may be due to patient having inactive lesions.  I would like to follow her clinically and repeat MRI brain and c-spine in 62-month.  If there are new lesions or she develops new symptoms sooner, I will start immunodulatory therapy.  Patient does not want to start any medications at this time for MS.   Her primary complaint is of generalized fatigue and excessive day time sleepiness.  She scored 15/24 on her Epworth sleepiness today.  I would like for her to get PSG with MSLT to see if there is untreated sleep-related disorder  contributing to her fatigue.    Pending results of PSG, will try amantadine or provigil, going forward.   PLAN/RECOMMENDATIONS:  1.  Sleep study with MSLT 2.  Increase vitamin D to 4000 units daily 3.  Cautioned against drowsy driving 4.  Return to clinic in 2-3 months   The duration of this appointment visit was 30 minutes of face-to-face time with the patient.  Greater than 50% of this time was spent in counseling, explanation of diagnosis, planning of further management, and coordination of care.   Thank you for allowing me to participate in patient's care.  If I can answer any additional questions, I would be pleased to do so.    Sincerely,    Brodi Kari K. PPosey Pronto DO

## 2014-03-12 NOTE — Patient Instructions (Addendum)
1.  Sleep study with MSLT 2.  Increase vitamin D to 4000 units daily 3.  Cautioned against drowsy driving 4.  Return to clinic in 2-3 months

## 2014-03-20 LAB — FUNGUS CULTURE W SMEAR: FUNGAL SMEAR: NONE SEEN

## 2014-04-16 ENCOUNTER — Ambulatory Visit (AMBULATORY_SURGERY_CENTER): Payer: Medicaid Other | Admitting: Gastroenterology

## 2014-04-16 ENCOUNTER — Encounter: Payer: Self-pay | Admitting: Gastroenterology

## 2014-04-16 VITALS — BP 124/83 | HR 71 | Temp 98.1°F | Resp 13 | Ht 65.0 in | Wt 228.0 lb

## 2014-04-16 DIAGNOSIS — D125 Benign neoplasm of sigmoid colon: Secondary | ICD-10-CM

## 2014-04-16 DIAGNOSIS — K2100 Gastro-esophageal reflux disease with esophagitis, without bleeding: Secondary | ICD-10-CM

## 2014-04-16 DIAGNOSIS — R1314 Dysphagia, pharyngoesophageal phase: Secondary | ICD-10-CM

## 2014-04-16 DIAGNOSIS — K921 Melena: Secondary | ICD-10-CM

## 2014-04-16 MED ORDER — SODIUM CHLORIDE 0.9 % IV SOLN: 500.0000 mL | INTRAVENOUS | Status: DC

## 2014-04-16 NOTE — Progress Notes (Signed)
Called to room to assist during endoscopic procedure.  Patient ID and intended procedure confirmed with present staff. Received instructions for my participation in the procedure from the performing physician.  

## 2014-04-16 NOTE — Op Note (Signed)
[place]  [address] [place], [address]   COLONOSCOPY PROCEDURE REPORT  PATIENT: Stephanie, Webb  MR#: [id number] BIRTHDATE: March 26, 1966 , 87  yrs. old GENDER: female ENDOSCOPIST: Ladene Artist, MD, [facility] REFERRED BY: Estell Harpin, MD PROCEDURE DATE:  04/16/2014 PROCEDURE:   Colonoscopy with snare polypectomy First Screening Colonoscopy - Avg.  risk and is 50 yrs.  old or older - No.  Prior Negative Screening - Now for repeat screening. N/A  History of Adenoma - Now for follow-up colonoscopy & has been > or = to 3 yrs.  N/A  Polyps Removed Today? Yes. ASA CLASS:   Class II INDICATIONS:hematochezia. MEDICATIONS: Monitored anesthesia care and Propofol 400 mg IV DESCRIPTION OF PROCEDURE:   After the risks benefits and alternatives of the procedure were thoroughly explained, informed consent was obtained.  The digital rectal exam revealed no abnormalities of the rectum.   The LB [id number]  endoscope was introduced through the anus and advanced to the cecum, which was identified by both the appendix and ileocecal valve. No adverse events experienced.   The quality of the prep was excellent, using MoviPrep  The instrument was then slowly withdrawn as the colon was fully examined.  COLON FINDINGS: A sessile polyp measuring 6 mm in size was found in the sigmoid colon.  A polypectomy was performed with a cold snare. The resection was complete, the polyp tissue was completely retrieved and sent to histology.   The examination was otherwise normal.  Retroflexed views revealed internal Grade I hemorrhoids. The time to cecum=1 minutes 03 seconds.  Withdrawal time=10 minutes 14 seconds.  The scope was withdrawn and the procedure completed. COMPLICATIONS: There were no immediate complications.  ENDOSCOPIC IMPRESSION: 1.   Sessile polyp in the sigmoid colon; polypectomy performed with a cold snare 2.   Grade l internal hemorrhoids  RECOMMENDATIONS: 1.  Await  pathology results 2.  Repeat colonoscopy in 5 years if polyp adenomatous; otherwise 10 years 3.  OTC preparation H supp twice daily as needed and rectal care instructions  eSigned:  Ladene Artist, MD, [facility] 04/16/2014 2:06 PM

## 2014-04-16 NOTE — Patient Instructions (Signed)
Discharge instructions given. Handouts on polyps,hemorrhoids,esophagitis,hiatal hernia and a dilatation diet. Resume previous medications. YOU HAD AN ENDOSCOPIC PROCEDURE TODAY AT [facility]: Refer to the procedure report that was given to you for any specific questions about what was found during the examination.  If the procedure report does not answer your questions, please call your gastroenterologist to clarify.  If you requested that your care partner not be given the details of your procedure findings, then the procedure report has been included in a sealed envelope for you to review at your convenience later.  YOU SHOULD EXPECT: Some feelings of bloating in the abdomen. Passage of more gas than usual.  Walking can help get rid of the air that was put into your GI tract during the procedure and reduce the bloating. If you had a lower endoscopy (such as a colonoscopy or flexible sigmoidoscopy) you may notice spotting of blood in your stool or on the toilet paper. If you underwent a bowel prep for your procedure, then you may not have a normal bowel movement for a few days.  DIET: Your first meal following the procedure should be a light meal and then it is ok to progress to your normal diet.  A half-sandwich or bowl of soup is an example of a good first meal.  Heavy or fried foods are harder to digest and may make you feel nauseous or bloated.  Likewise meals heavy in dairy and vegetables can cause extra gas to form and this can also increase the bloating.  Drink plenty of fluids but you should avoid alcoholic beverages for 24 hours.  ACTIVITY: Your care partner should take you home directly after the procedure.  You should plan to take it easy, moving slowly for the rest of the day.  You can resume normal activity the day after the procedure however you should NOT DRIVE or use heavy machinery for 24 hours (because of the sedation medicines used during the test).    SYMPTOMS TO  REPORT IMMEDIATELY: A gastroenterologist can be reached at any hour.  During normal business hours, 8:30 AM to 5:00 PM Monday through Friday, call [phone].  After hours and on weekends, please call the GI answering service at [phone] who will take a message and have the physician on call contact you.   Following lower endoscopy (colonoscopy or flexible sigmoidoscopy):  Excessive amounts of blood in the stool  Significant tenderness or worsening of abdominal pains  Swelling of the abdomen that is new, acute  Fever of 100F or higher  Following upper endoscopy (EGD)  Vomiting of blood or coffee ground material  New chest pain or pain under the shoulder blades  Painful or persistently difficult swallowing  New shortness of breath  Fever of 100F or higher  Black, tarry-looking stools  FOLLOW UP: If any biopsies were taken you will be contacted by phone or by letter within the next 1-3 weeks.  Call your gastroenterologist if you have not heard about the biopsies in 3 weeks.  Our staff will call the home number listed on your records the next business day following your procedure to check on you and address any questions or concerns that you may have at that time regarding the information given to you following your procedure. This is a courtesy call and so if there is no answer at the home number and we have not heard from you through the emergency physician on call, we will assume that you have  returned to your regular daily activities without incident.  SIGNATURES/CONFIDENTIALITY: You and/or your care partner have signed paperwork which will be entered into your electronic medical record.  These signatures attest to the fact that that the information above on your After Visit Summary has been reviewed and is understood.  Full responsibility of the confidentiality of this discharge information lies with you and/or your care-partner.

## 2014-04-16 NOTE — Progress Notes (Signed)
Report to PACU, RN, vss, BBS= Clear.  

## 2014-04-16 NOTE — Op Note (Signed)
[place]  [address]. [place], [address]   ENDOSCOPY PROCEDURE REPORT  PATIENT: Stephanie Webb, Stephanie Webb  MR#: [id number] BIRTHDATE: 1965-07-10 , 53  yrs. old GENDER: female ENDOSCOPIST: Ladene Artist, MD, [facility] REFERRED BY:  Estell Harpin, MD PROCEDURE DATE:  04/16/2014 PROCEDURE:  EGD w/ wire guided (savary) dilation ASA CLASS:     Class II INDICATIONS:  dysphagia and history of esophageal reflux. MEDICATIONS: Residual sedation present, Monitored anesthesia care, Propofol 100 mg IV, and Lidocaine 150 mg IV TOPICAL ANESTHETIC: none DESCRIPTION OF PROCEDURE: After the risks benefits and alternatives of the procedure were thoroughly explained, informed consent was obtained.  The LB [id number] endoscope was introduced through the mouth and advanced to the second portion of the duodenum , Without limitations.  The instrument was slowly withdrawn as the mucosa was fully examined.  ESOPHAGUS: There was LA Class B esophagitis (One or more mucosal breaks > 20mm, but without continuity across mucosal folds) noted. The esophagus was otherwise normal.   A 17 mm Savary dilator was passed over a guidewire for dysphagia without a stricture with no heme or resistance noted. STOMACH:  The mucosa of the entire stomach appeared normal. DUODENUM: The duodenal mucosa showed no abnormalities in the bulb and 2nd part of the duodenum.  Retroflexed views revealed a small hiatal hernia.  The scope was then withdrawn from the patient and the procedure completed.  COMPLICATIONS: There were no immediate complications.  ENDOSCOPIC IMPRESSION: 1.   LA Class B esophagitis 2.   Small hiatal hernia 3.   The EGD otherwise appeared normal  RECOMMENDATIONS: 1.  Anti-reflux regimen long term 2.  Continue PPI bid long term 3.  Post dilation instructions 4.  Follow up with PCP for ongoing care. GI follow up as needed.  eSigned:  Ladene Artist, MD, [facility] 04/16/2014 2:13 PM

## 2014-04-17 ENCOUNTER — Telehealth: Payer: Self-pay | Admitting: *Deleted

## 2014-04-17 NOTE — Telephone Encounter (Signed)
  Follow up Call-  Call back number 04/16/2014  Post procedure Call Back phone  # [phone]  Permission to leave phone message No     Patient questions:  Do you have a fever, pain , or abdominal swelling? No. Pain Score  0 *  Have you tolerated food without any problems? Yes.    Have you been able to return to your normal activities? Yes.    Do you have any questions about your discharge instructions: Diet   No. Medications  No. Follow up visit  No.  Do you have questions or concerns about your Care? No.  Actions: * If pain score is 4 or above: No action needed, pain <4.

## 2014-04-22 ENCOUNTER — Encounter: Payer: Self-pay | Admitting: Gastroenterology

## 2014-04-24 ENCOUNTER — Emergency Department: Payer: Self-pay | Admitting: Emergency Medicine

## 2014-04-24 LAB — CBC WITH DIFFERENTIAL/PLATELET
BASOS PCT: 0.6 %
Basophil #: 0 10*3/uL (ref 0.0–0.1)
Eosinophil #: 0.2 10*3/uL (ref 0.0–0.7)
Eosinophil %: 3 %
HCT: 43.1 % (ref 35.0–47.0)
HGB: 14.3 g/dL (ref 12.0–16.0)
Lymphocyte #: 3.3 10*3/uL (ref 1.0–3.6)
Lymphocyte %: 41.5 %
MCH: 31.5 pg (ref 26.0–34.0)
MCHC: 33.2 g/dL (ref 32.0–36.0)
MCV: 95 fL (ref 80–100)
MONOS PCT: 6.9 %
Monocyte #: 0.6 x10 3/mm (ref 0.2–0.9)
Neutrophil #: 3.8 10*3/uL (ref 1.4–6.5)
Neutrophil %: 48 %
PLATELETS: 191 10*3/uL (ref 150–440)
RBC: 4.54 10*6/uL (ref 3.80–5.20)
RDW: 13.4 % (ref 11.5–14.5)
WBC: 8 10*3/uL (ref 3.6–11.0)

## 2014-04-24 LAB — URINALYSIS, COMPLETE
BILIRUBIN, UR: NEGATIVE
Bacteria: NONE SEEN
Blood: NEGATIVE
Glucose,UR: >=500 mg/dL (ref 0–75)
Ketone: NEGATIVE
LEUKOCYTE ESTERASE: NEGATIVE
Nitrite: POSITIVE
PH: 5 (ref 4.5–8.0)
Protein: NEGATIVE
RBC,UR: 11 /HPF (ref 0–5)
Specific Gravity: 1.023 (ref 1.003–1.030)
WBC UR: NONE SEEN /HPF (ref 0–5)

## 2014-04-24 LAB — BASIC METABOLIC PANEL
ANION GAP: 8 (ref 7–16)
BUN: 10 mg/dL (ref 7–18)
CALCIUM: 8.3 mg/dL — AB (ref 8.5–10.1)
CO2: 26 mmol/L (ref 21–32)
Chloride: 104 mmol/L (ref 98–107)
Creatinine: 0.79 mg/dL (ref 0.60–1.30)
EGFR (Non-African Amer.): >60
Glucose: 223 mg/dL — ABNORMAL HIGH (ref 65–99)
OSMOLALITY: 282 (ref 275–301)
Potassium: 3.8 mmol/L (ref 3.5–5.1)
SODIUM: 138 mmol/L (ref 136–145)

## 2014-04-24 LAB — WET PREP, GENITAL

## 2014-04-25 LAB — GC/CHLAMYDIA PROBE AMP

## 2014-06-12 ENCOUNTER — Encounter: Payer: Self-pay | Admitting: Neurology

## 2014-06-12 ENCOUNTER — Ambulatory Visit (INDEPENDENT_AMBULATORY_CARE_PROVIDER_SITE_OTHER): Payer: Medicaid Other | Admitting: Neurology

## 2014-06-12 VITALS — BP 120/84 | HR 78 | Ht 65.0 in | Wt 227.6 lb

## 2014-06-12 DIAGNOSIS — R5381 Other malaise: Secondary | ICD-10-CM

## 2014-06-12 DIAGNOSIS — R202 Paresthesia of skin: Secondary | ICD-10-CM

## 2014-06-12 DIAGNOSIS — G379 Demyelinating disease of central nervous system, unspecified: Secondary | ICD-10-CM

## 2014-06-12 NOTE — Progress Notes (Signed)
Follow-up Visit   Date: 06/12/2014    Stephanie Webb MRN: [id number] DOB: 1966-03-12   Interim History: Stephanie Webb is a 49 y.o. right-handed Caucasian female with history of hypertension, GERD, irritable bowel syndrome, and palpitations returning to the clinic for follow-up of generalized fatigue and ?multiple sclerosis.    History of present illness: She feels that she has multiple sclerosis because she complains of 20-year history of transient bilateral vision loss, double vision, generalized fatigue, numbness/tingling of the arms > legs, and muscle aches. All of her symptoms are fluctuating and occur for several days at a time about 3-4 times per year. She does not feel depressed. In July 2014, she says she had an episode of bilateral leg numbness and was unable to lift her legs. She did not go to the ED, but massaged her legs which resolved symptoms. Dilated eye exams has been normal.  She also reports having unrefreshed sleep, mild snoring, and waking up several times at night.   UPDATE 01/28/3014:  Patient underwent MRI brain which showed white matter changes involving the cerebral hemisphere and upper cervical cord, concerning for demyelinating disease.  No active disease.  She continues to have intermittent symptoms of vision changes, fatigue, and sensory changes.    UPDATE 03/12/2014:  Patient has CSF testing which was essentially normal, except elevated CSF glucose.  Particularly, there was no signs of inflammation.  Her primary complaints is ongoing problems with fatigue and excessive daytime sleepiness.  She reports taking a daily nap but still does not feel refreshed. She feels as if she could sleep all the time.  No associated cataplexy or hallucinations.  She says that her daughter has spells of sleep paralysis.  UPDATE 06/12/2013:  She did not get called to schedule sleep evaluation, but says that her fatigue has been improved since taking vitamin D.  She reports having a  6-week spell of tingling of the legs and arm, nausea, headaches in November, but has been relatively well since then.     Medications:  Current Outpatient Prescriptions on File Prior to Visit  Medication Sig Dispense Refill  . Cholecalciferol (VITAMIN D) 2000 UNITS tablet Take 2,000 Units by mouth daily.    . metoprolol succinate (TOPROL-XL) 50 MG 24 hr tablet Take 50 mg by mouth every evening. Take with or immediately following a meal.    . Multiple Vitamins-Minerals (QC WOMENS DAILY MULTIVITAMIN PO) Take by mouth.    Marland Kitchen omeprazole (PRILOSEC) 40 MG capsule Take 1 capsule (40 mg total) by mouth 2 (two) times daily. 60 capsule 5   No current facility-administered medications on file prior to visit.    Allergies: No Known Allergies   Review of Systems:  CONSTITUTIONAL: No fevers, chills, night sweats, or weight loss.   EYES: No visual changes or eye pain ENT: No hearing changes.  No history of nose bleeds.   RESPIRATORY: No cough, wheezing and shortness of breath.   CARDIOVASCULAR: Negative for chest pain, and palpitations.   GI: Negative for abdominal discomfort, blood in stools or black stools.  No recent change in bowel habits.   GU:  No history of incontinence.   MUSCLOSKELETAL:+ history of joint pain or swelling.  +myalgias.   SKIN: Negative for lesions, rash, and itching.   ENDOCRINE: Negative for cold or heat intolerance, polydipsia or goiter.   PSYCH: No  depression or anxiety symptoms.   NEURO: As Above.   Vital Signs:  BP 120/84 mmHg  Pulse 78  Ht 5'  5" (1.651 m)  Wt 227 lb 9 oz (103.222 kg)  BMI 37.87 kg/m2  SpO2 96%  Neurological Exam: MENTAL STATUS including orientation to time, place, person, recent and remote memory, attention span and concentration, language, and fund of knowledge is normal.  Speech is not dysarthric.  CRANIAL NERVES:  Pupils equal round and reactive to light.  Normal conjugate, extra-ocular eye movements in all directions of gaze.  No ptosis.  Face is symmetric. Palate elevates symmetrically.  Tongue is midline.  MOTOR:  Motor strength is 5/5 in all extremities.  No pronator drift.  Tone is normal.    MSRs:  Reflexes are 2+/4 throughout.   COORDINATION/GAIT:   Gait narrow based and stable.   Data: MRI brain wwo contrast 09/05/2013: Multiple foci of white matter T2 signal abnormality in both cerebral hemispheres and in the upper cervical spinal cord. Findings are nonspecific but most concerning for demyelinating disease. No abnormal enhancement to suggest active demyelination.   MRI cervical spine wwo contrast 09/14/2013: 1. Cervical MRI fails to confirm definite abnormal signal or enhancement within the cervical cord.  2. Small disc protrusions at C5-6 and C6-7 without cord deformity or foraminal compromise.  Labs 08/08/2013: WBC 7.7, Hgb 14.2, PLT 220, Na 140, K 4.6, Cr 0.66, AST 25, ALT 43*, TSH 1.6, ESR 3, vitamin B12 553, ANA neg  CSF 02/20/2014:  R0 W3 G107* P26  IgG 1.3, oligoclonal bands absent, ACE 7, cytology negative   IMPRESSION: Stephanie Webb is a 17 year-old female returning for evaluation of a constellation of complaints (paresthesias, vision changes, ringing in the ear, myalgias, generalized fatigue) whose MRI briain showed white matter changes concerning for demyelinating disease. Imaging was personally reviewed with the patient today.  CSF testing, however, did not support diagnosis of multiple sclerosis, and may be due to patient having inactive lesions.  She had one 6-week spells of various neurological symptoms in November, but did not notify the office.  I would like to repeat imaging to assess for increased disease burden which would favor multiple sclerosis.  We again discussed immunomodulatory therapy, but she does not wish to start any medications at this time.   PLAN/RECOMMENDATIONS:  1.  MRI brain and cervical spine  2.  Continue vitamin D to 4000 units daily 3.  Consider EMG going forward for paresthesias if  MRI is stable 4.  Return to clinic in 3 months   The duration of this appointment visit was 25 minutes of face-to-face time with the patient.  Greater than 50% of this time was spent in counseling, explanation of diagnosis, planning of further management, and coordination of care.   Thank you for allowing me to participate in patient's care.  If I can answer any additional questions, I would be pleased to do so.    Sincerely,    Royale Lennartz K. Posey Pronto, DO

## 2014-06-12 NOTE — Patient Instructions (Signed)
1.  MRI brain and cervical spine schedule in late February 2.  Return to clinic in 57-months

## 2014-07-29 ENCOUNTER — Other Ambulatory Visit: Payer: Medicaid Other

## 2014-07-29 ENCOUNTER — Ambulatory Visit
Admission: RE | Admit: 2014-07-29 | Discharge: 2014-07-29 | Disposition: A | Discharge status: Home / Self Care | Payer: Medicaid Other | Source: Ambulatory Visit | Attending: Neurology | Admitting: Neurology

## 2014-07-29 DIAGNOSIS — R202 Paresthesia of skin: Secondary | ICD-10-CM

## 2014-07-29 DIAGNOSIS — G379 Demyelinating disease of central nervous system, unspecified: Secondary | ICD-10-CM

## 2014-07-29 DIAGNOSIS — R5381 Other malaise: Secondary | ICD-10-CM

## 2014-07-29 MED ORDER — GADOBENATE DIMEGLUMINE 529 MG/ML IV SOLN
20.0000 mL | Freq: Once | INTRAVENOUS | Status: AC | PRN
  Administered 2014-07-29: 20 mL via INTRAVENOUS

## 2014-07-30 ENCOUNTER — Telehealth: Payer: Self-pay | Admitting: Neurology

## 2014-07-30 NOTE — Telephone Encounter (Signed)
Called and discussed results of MRI brain and c-spine.  There is mild progression of demyelinating disease.  She is not interested on start immunomodulatory therapy.  If she develops worsening symptoms, I will evaluate in clinic and offer trial of IVMP.  Donika K. Posey Pronto, DO

## 2014-08-05 ENCOUNTER — Telehealth: Payer: Self-pay | Admitting: Neurology

## 2014-08-05 NOTE — Telephone Encounter (Signed)
Pt called wanting to speak to DR. Patel. She stated that she is having problems with her MS. C/b [phone]

## 2014-08-05 NOTE — Telephone Encounter (Signed)
Pt called again f/u on the message from below. C/b [phone]

## 2014-08-05 NOTE — Telephone Encounter (Signed)
I spoke with patient and informed her that Dr. Posey Pronto would like to see her Thursday at 11:30.  Patient agreed with plan.

## 2014-08-07 ENCOUNTER — Encounter: Payer: Self-pay | Admitting: Neurology

## 2014-08-07 ENCOUNTER — Ambulatory Visit (INDEPENDENT_AMBULATORY_CARE_PROVIDER_SITE_OTHER): Payer: Medicaid Other | Admitting: Neurology

## 2014-08-07 ENCOUNTER — Telehealth: Payer: Self-pay | Admitting: Neurology

## 2014-08-07 VITALS — BP 130/68 | Ht 65.0 in | Wt 227.6 lb

## 2014-08-07 DIAGNOSIS — G35 Multiple sclerosis: Secondary | ICD-10-CM

## 2014-08-07 DIAGNOSIS — R5382 Chronic fatigue, unspecified: Secondary | ICD-10-CM

## 2014-08-07 MED ORDER — MODAFINIL 100 MG PO TABS: 100.0000 mg | ORAL_TABLET | Freq: Every day | ORAL | Status: DC

## 2014-08-07 NOTE — Telephone Encounter (Signed)
Pt cancel her 09/11/14 f/u appt and r/s to 11/07/14

## 2014-08-07 NOTE — Patient Instructions (Addendum)
1.  Start provigil 100mg  daily 2.  Continue vitamin D to 4000 units daily 3.  Literature provided for MS medications, please review so we can discuss at your next visit 4.  Return to clinic in 3 months

## 2014-08-07 NOTE — Progress Notes (Signed)
Note faxed.

## 2014-08-07 NOTE — Progress Notes (Signed)
Follow-up Visit   Date: 08/07/2014    Stephanie Webb MRN: [id number] DOB: [birth year]   Interim History: Stephanie Webb is a 49 y.o. right-handed Caucasian female with history of hypertension, GERD, irritable bowel syndrome, and palpitations returning to the clinic for follow-up of generalized fatigue and multiple sclerosis.    History of present illness: She feels that she has multiple sclerosis because she complains of 20-year history of transient bilateral vision loss, double vision, generalized fatigue, numbness/tingling of the arms > legs, and muscle aches. All of her symptoms are fluctuating and occur for several days at a time about 3-4 times per year. She does not feel depressed. In July 2014, she says she had an episode of bilateral leg numbness and was unable to lift her legs. She did not go to the ED, but massaged her legs which resolved symptoms. Dilated eye exams has been normal.  She also reports having unrefreshed sleep, mild snoring, and waking up several times at night.   UPDATE 01/28/3014:  Patient underwent MRI brain which showed white matter changes involving the cerebral hemisphere and upper cervical cord, concerning for demyelinating disease.  No active disease.  She continues to have intermittent symptoms of vision changes, fatigue, and sensory changes.    UPDATE 03/12/2014:  Patient has CSF testing which was essentially normal, except elevated CSF glucose.  Particularly, there was no signs of inflammation.  Her primary complaints is ongoing problems with fatigue and excessive daytime sleepiness.  She reports taking a daily nap but still does not feel refreshed. She feels as if she could sleep all the time.  No associated cataplexy or hallucinations.  She says that her daughter has spells of sleep paralysis.  UPDATE 06/12/2013:  She did not get called to schedule sleep evaluation, but says that her fatigue has been improved since taking vitamin D.  She reports having a  6-week spell of tingling of the legs and arm, nausea, headaches in November, but has been relatively well since then.    UPDATE 08/07/2014:   Two days ago, she reports being very fatigued and impaired thinking. She has multiple flares of fatigue lasting weeks, without provocation and always feels tired.  Vitamin D definitely helped her energy, but she can't find what she gets waves of generalized fatigue.  MRI brain performed on 07/29/2014 showed one new right cerebral peduncle lesion without enhancement.  Medications:  Current Outpatient Prescriptions on File Prior to Visit  Medication Sig Dispense Refill  . Cholecalciferol (VITAMIN D) 2000 UNITS tablet Take 2,000 Units by mouth daily.    . metoprolol succinate (TOPROL-XL) 50 MG 24 hr tablet Take 50 mg by mouth every evening. Take with or immediately following a meal.    . Multiple Vitamins-Minerals (QC WOMENS DAILY MULTIVITAMIN PO) Take by mouth.    Marland Kitchen omeprazole (PRILOSEC) 40 MG capsule Take 1 capsule (40 mg total) by mouth 2 (two) times daily. 60 capsule 5   No current facility-administered medications on file prior to visit.    Allergies: No Known Allergies   Review of Systems:  CONSTITUTIONAL: No fevers, chills, night sweats, or weight loss.   EYES: No visual changes or eye pain ENT: No hearing changes.  No history of nose bleeds.   RESPIRATORY: No cough, wheezing and shortness of breath.   CARDIOVASCULAR: Negative for chest pain, and palpitations.   GI: Negative for abdominal discomfort, blood in stools or black stools.  No recent change in bowel habits.   GU:  No history  of incontinence.   MUSCLOSKELETAL:+ history of joint pain or swelling.  +myalgias.   SKIN: Negative for lesions, rash, and itching.   ENDOCRINE: Negative for cold or heat intolerance, polydipsia or goiter.   PSYCH: No  depression or anxiety symptoms.   NEURO: As Above.   Vital Signs:  BP 130/68 mmHg  Ht 5' 5" (1.651 m)  Wt 227 lb 9.6 oz (103.239 kg)  BMI  37.87 kg/m2  Neurological Exam: MENTAL STATUS including orientation to time, place, person, recent and remote memory, attention span and concentration, language, and fund of knowledge is normal.  Speech is not dysarthric.  CRANIAL NERVES:  Pupils equal round and reactive to light.  Normal conjugate, extra-ocular eye movements in all directions of gaze.  No ptosis. Face is symmetric. Palate elevates symmetrically.  Tongue is midline.  MOTOR:  Motor strength is 5/5 in all extremities.  No pronator drift.  Tone is normal.    MSRs:  Reflexes are 2+/4 throughout.   COORDINATION/GAIT:   Gait narrow based and stable.   Data: MRI brain wwo contrast 09/05/2013: Multiple foci of white matter T2 signal abnormality in both cerebral hemispheres and in the upper cervical spinal cord. Findings are nonspecific but most concerning for demyelinating disease. No abnormal enhancement to suggest active demyelination.   MRI cervical spine wwo contrast 09/14/2013: 1. Cervical MRI fails to confirm definite abnormal signal or enhancement within the cervical cord.  2. Small disc protrusions at C5-6 and C6-7 without cord deformity or foraminal compromise.  Labs 08/08/2013: WBC 7.7, Hgb 14.2, PLT 220, Na 140, K 4.6, Cr 0.66, AST 25, ALT 43*, TSH 1.6, ESR 3, vitamin B12 553, ANA neg  CSF 02/20/2014:  R0 W3 G107* P26  IgG 1.3, oligoclonal bands absent, ACE 7, cytology negative  MRI brain and cervical spine wwo contrast 07/29/2014:   1. Question new 4 mm T2 lesion within the right cerebral peduncle.  This could represent progression of a demyelinating process. 2. Subcortical T2 hyperintensities are otherwise stable. 3. Stable left parietal occipital infarct. 4. Minimal spondylosis of the cervical spine without change. There is no significant cord signal abnormality within the cervical spine.  IMPRESSION: Stephanie Webb is a 46 year-old female returning for evaluation of a constellation of complaints (paresthesias, vision  changes, ringing in the ear, myalgias, generalized fatigue) whose MRI briain showed white matter changes concerning for demyelinating disease. She underwent repeat imaging of the brain and cervical spine which shows a new right cerebral peduncle lesion.  With the progression of white matter changes, I do feel that she has multiple sclerosis.   CSF testing, however, did not support diagnosis of multiple sclerosis, and may be due to patient having inactive lesions.   We again discussed immunomodulatory therapy, but she does not wish to start any medications at this time.  Literature was provided for her to review.   Do not feel that a trial of IVMP would be prudent with her unchanged exam and non-enhancing MRI, so will treat her for MS-related fatigue with trial of provigil.  PLAN/RECOMMENDATIONS:  1.  Start provigil 171m daily 2.  Continue vitamin D to 4000 units daily 3.  Literature provided on immunomodulatory therapies 4.  Return to clinic in 3 months   The duration of this appointment visit was 25 minutes of face-to-face time with the patient.  Greater than 50% of this time was spent in counseling, explanation of diagnosis, planning of further management, and coordination of care.   Thank you for allowing  me to participate in patient's care.  If I can answer any additional questions, I would be pleased to do so.    Sincerely,    Daveon Arpino K. Posey Pronto, DO

## 2014-09-11 ENCOUNTER — Ambulatory Visit: Payer: Medicaid Other | Admitting: Neurology

## 2014-10-31 ENCOUNTER — Telehealth: Payer: Self-pay | Admitting: Neurology

## 2014-10-31 NOTE — Telephone Encounter (Signed)
Patient is requesting a handicap sticker.

## 2014-10-31 NOTE — Telephone Encounter (Signed)
Pt needs to talk to someone about MS please call [phone]

## 2014-11-04 NOTE — Telephone Encounter (Signed)
Ok to give temporary handicap placard.

## 2014-11-04 NOTE — Telephone Encounter (Signed)
Attempted to contact patient to let her know that her sticker is ready.

## 2014-11-04 NOTE — Telephone Encounter (Signed)
Patient coming in this week.

## 2014-11-07 ENCOUNTER — Ambulatory Visit: Payer: Medicaid Other | Admitting: Neurology

## 2015-04-03 ENCOUNTER — Encounter: Payer: Self-pay | Admitting: Neurology

## 2015-04-03 ENCOUNTER — Ambulatory Visit (INDEPENDENT_AMBULATORY_CARE_PROVIDER_SITE_OTHER): Payer: Self-pay | Admitting: Neurology

## 2015-04-03 VITALS — BP 120/84 | HR 86 | Ht 65.0 in | Wt 231.2 lb

## 2015-04-03 DIAGNOSIS — G379 Demyelinating disease of central nervous system, unspecified: Secondary | ICD-10-CM

## 2015-04-03 NOTE — Patient Instructions (Signed)
Return to clinic in March

## 2015-04-03 NOTE — Progress Notes (Signed)
Follow-up Visit   Date: 04/03/2015    Stephanie Webb MRN: [id number] DOB: 22-May-1966   Interim History: Stephanie Webb is a 49 y.o. right-handed Caucasian female with history of hypertension, GERD, irritable bowel syndrome, and palpitations returning to the clinic for follow-up of multiple sclerosis.    History of present illness: She feels that she has multiple sclerosis because she complains of 20-year history of transient bilateral vision loss, double vision, generalized fatigue, numbness/tingling of the arms > legs, and muscle aches. All of her symptoms are fluctuating and occur for several days at a time about 3-4 times per year. She does not feel depressed. In July 2014, she says she had an episode of bilateral leg numbness and was unable to lift her legs. She did not go to the ED, but massaged her legs which resolved symptoms. Dilated eye exams has been normal.  She also reports having unrefreshed sleep, mild snoring, and waking up several times at night.   UPDATE 01/28/3014:  Patient underwent MRI brain which showed white matter changes involving the cerebral hemisphere and upper cervical cord, concerning for demyelinating disease.  No active disease.  She continues to have intermittent symptoms of vision changes, fatigue, and sensory changes.    UPDATE 03/12/2014:  Patient has CSF testing which was essentially normal, except elevated CSF glucose.  Particularly, there was no signs of inflammation.  Her primary complaints is ongoing problems with fatigue and excessive daytime sleepiness.  She reports taking a daily nap but still does not feel refreshed. She feels as if she could sleep all the time.  No associated cataplexy or hallucinations.  She says that her daughter has spells of sleep paralysis.  UPDATE 06/12/2013:  She did not get called to schedule sleep evaluation, but says that her fatigue has been improved since taking vitamin D.  She reports having a 6-week spell of tingling of  the legs and arm, nausea, headaches in November, but has been relatively well since then.    UPDATE 08/07/2014:   Two days ago, she reports being very fatigued and impaired thinking. She has multiple flares of fatigue lasting weeks, without provocation and always feels tired.  Vitamin D definitely helped her energy, but she can't find what she gets waves of generalized fatigue.  MRI brain performed on 07/29/2014 showed one new right cerebral peduncle lesion without enhancement.  UPDATE 04/03/2015:   She does not feel that there have been any new issues.  She feels that during the summer the walking was a little more difficult because of the warmer temperature. She is trying to eat well.  She did not take provigil because she was concerned about side effects with her beta-blockers and overall energy is good.  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  Medications:  Current Outpatient Prescriptions on File Prior to Visit  Medication Sig Dispense Refill  . Cholecalciferol (VITAMIN D) 2000 UNITS tablet Take 2,000 Units by mouth daily.    . metoprolol succinate (TOPROL-XL) 50 MG 24 hr tablet Take 50 mg by mouth every evening. Take with or immediately following a meal.    . modafinil (PROVIGIL) 100 MG tablet Take 1 tablet (100 mg total) by mouth daily. 30 tablet 5  . Multiple Vitamins-Minerals (QC WOMENS DAILY MULTIVITAMIN PO) Take by mouth.    Marland Kitchen omeprazole (PRILOSEC) 40 MG capsule Take 1 capsule (40 mg total) by mouth 2 (two) times daily. 60 capsule 5  No current facility-administered medications on file prior to visit.    Allergies: No Known Allergies   Review of Systems:  CONSTITUTIONAL: No fevers, chills, night sweats, or weight loss.   EYES: No visual changes or eye pain ENT: No hearing changes.  No history of nose bleeds.   RESPIRATORY: No cough, wheezing and shortness of breath.   CARDIOVASCULAR: Negative for chest pain, and  palpitations.   GI: Negative for abdominal discomfort, blood in stools or black stools.  No recent change in bowel habits.   GU:  No history of incontinence.   MUSCLOSKELETAL:+ history of joint pain or swelling.  +myalgias.   SKIN: Negative for lesions, rash, and itching.   ENDOCRINE: Negative for cold or heat intolerance, polydipsia or goiter.   PSYCH: No  depression or anxiety symptoms.   NEURO: As Above.   Vital Signs:  BP 120/84 mmHg  Pulse 86  Ht $R'5\' 5"'Og$  (1.651 m)  Wt 231 lb 4 oz (104.894 kg)  BMI 38.48 kg/m2  SpO2 97%  Neurological Exam: MENTAL STATUS including orientation to time, place, person, recent and remote memory, attention span and concentration, language, and fund of knowledge is normal.  Speech is not dysarthric.  CRANIAL NERVES:  Pupils equal round and reactive to light.  Normal conjugate, extra-ocular eye movements in all directions of gaze.  No ptosis. Face is symmetric. Palate elevates symmetrically.  Tongue is midline.  MOTOR:  Motor strength is 5/5 in all extremities.  No pronator drift.  Tone is normal.    MSRs:  Reflexes are 2+/4 throughout.    COORDINATION/GAIT:   Gait narrow based and stable.  Able to stand from low chair without pushing off.  Mildly reduced rate of finger tapping on right.  Data: MRI brain wwo contrast 09/05/2013: Multiple foci of white matter T2 signal abnormality in both cerebral hemispheres and in the upper cervical spinal cord. Findings are nonspecific but most concerning for demyelinating disease. No abnormal enhancement to suggest active demyelination.   MRI cervical spine wwo contrast 09/14/2013: 1. Cervical MRI fails to confirm definite abnormal signal or enhancement within the cervical cord.  2. Small disc protrusions at C5-6 and C6-7 without cord deformity or foraminal compromise.  Labs 08/08/2013: WBC 7.7, Hgb 14.2, PLT 220, Na 140, K 4.6, Cr 0.66, AST 25, ALT 43*, TSH 1.6, ESR 3, vitamin B12 553, ANA neg  CSF 02/20/2014:  R0 W3  G107* P26  IgG 1.3, oligoclonal bands absent, ACE 7, cytology negative  MRI brain and cervical spine wwo contrast 07/29/2014:   1. Question new 4 mm T2 lesion within the right cerebral peduncle.  This could represent progression of a demyelinating process. 2. Subcortical T2 hyperintensities are otherwise stable. 3. Stable left parietal occipital infarct. 4. Minimal spondylosis of the cervical spine without change. There is no significant cord signal abnormality within the cervical spine.  IMPRESSION: Stephanie Webb is a 55 year-old female returning for evaluation of a constellation of complaints (paresthesias, vision changes, ringing in the ear, myalgias, generalized fatigue) whose MRI brain showed white matter changes concerning for demyelinating disease. She underwent repeat imaging of the brain and cervical spine which shows a new right cerebral peduncle lesion.  With the progression of white matter changes, I do feel that she has multiple sclerosis.   CSF testing, however, did not support diagnosis of multiple sclerosis, and may be due to patient having inactive lesions.   She is not interesting in starting disease modifying agent and has remained stable.  PLAN/RECOMMENDATIONS:  1.  Continue vitamin D to 4000 units daily 2.  Temporary handicap placard given - stressed to only to use when gait is impaired 3.  Discussed that in my opinion she does not qualify for disabilty, although if there is temporary exacerbation and she requires time away for treatment, that may be considered 4.  MRI brain and cervical spine at next visit  Return to clinic in 5-6 months   The duration of this appointment visit was 25 minutes of face-to-face time with the patient.  Greater than 50% of this time was spent in counseling, explanation of diagnosis, planning of further management, and coordination of care.   Thank you for allowing me to participate in patient's care.  If I can answer any additional questions, I  would be pleased to do so.    Sincerely,    Donika K. Posey Pronto, DO

## 2015-05-11 ENCOUNTER — Telehealth: Payer: Self-pay | Admitting: Neurology

## 2015-05-11 NOTE — Telephone Encounter (Signed)
PT called in regards to her application for a handicap plaque and that it was missing the doctors license number/[name] CB# [phone]

## 2015-05-11 NOTE — Telephone Encounter (Signed)
Attempted to contact patient.  No answer and no voicemail. 

## 2015-05-14 ENCOUNTER — Telehealth: Payer: Self-pay | Admitting: Neurology

## 2015-05-14 NOTE — Telephone Encounter (Signed)
Patient given Dr. Serita Grit license # for her handicap placard.

## 2015-05-14 NOTE — Telephone Encounter (Signed)
VM-PT left message to have Dr Serita Grit nurse call her back/[name] CB# [phone]

## 2015-09-04 ENCOUNTER — Encounter: Payer: Self-pay | Admitting: Neurology

## 2015-09-04 ENCOUNTER — Ambulatory Visit (INDEPENDENT_AMBULATORY_CARE_PROVIDER_SITE_OTHER): Payer: Self-pay | Admitting: Neurology

## 2015-09-04 VITALS — BP 130/84 | HR 90 | Ht 65.0 in | Wt 228.2 lb

## 2015-09-04 DIAGNOSIS — G35 Multiple sclerosis: Secondary | ICD-10-CM

## 2015-09-04 DIAGNOSIS — G4762 Sleep related leg cramps: Secondary | ICD-10-CM

## 2015-09-04 NOTE — Progress Notes (Signed)
Follow-up Visit   Date: 09/04/2015    Stephanie Webb MRN: [id number] DOB: 07-Aug-1965   Interim History: Stephanie Webb is a 50 y.o. right-handed Caucasian female with history of hypertension, GERD, irritable bowel syndrome, and palpitations returning to the clinic for follow-up of multiple sclerosis.    History of present illness: She feels that she has multiple sclerosis because she complains of 20-year history of transient bilateral vision loss, double vision, generalized fatigue, numbness/tingling of the arms > legs, and muscle aches. All of her symptoms are fluctuating and occur for several days at a time about 3-4 times per year. She does not feel depressed. In July 2014, she says she had an episode of bilateral leg numbness and was unable to lift her legs. She did not go to the ED, but massaged her legs which resolved symptoms. Dilated eye exams has been normal.  She also reports having unrefreshed sleep, mild snoring, and waking up several times at night.   UPDATE 01/28/3014:  Patient underwent MRI brain which showed white matter changes involving the cerebral hemisphere and upper cervical cord, concerning for demyelinating disease.  No active disease.  She continues to have intermittent symptoms of vision changes, fatigue, and sensory changes.    UPDATE 03/12/2014:  Patient has CSF testing which was essentially normal, except elevated CSF glucose.  Particularly, there was no signs of inflammation.  Her primary complaints is ongoing problems with fatigue and excessive daytime sleepiness.  She reports taking a daily nap but still does not feel refreshed. She feels as if she could sleep all the time.  No associated cataplexy or hallucinations.  She says that her daughter has spells of sleep paralysis.  UPDATE 06/12/2013:  She did not get called to schedule sleep evaluation, but says that her fatigue has been improved since taking vitamin D.  She reports having a 6-week spell of tingling of  the legs and arm, nausea, headaches in November, but has been relatively well since then.    UPDATE 08/07/2014:   Two days ago, she reports being very fatigued and impaired thinking. She has multiple flares of fatigue lasting weeks, without provocation and always feels tired.  Vitamin D definitely helped her energy, but she can't find what she gets waves of generalized fatigue.  MRI brain performed on 07/29/2014 showed one new right cerebral peduncle lesion without enhancement.  UPDATE 04/03/2015:   She does not feel that there have been any new issues.  She feels that during the summer the walking was a little more difficult because of the warmer temperature. She is trying to eat well.  She did not take provigil because she was concerned about side effects with her beta-blockers and overall energy is good.  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  UPDATE 09/04/2015:  She reports doing well over the past 63-month.  She had a few days, where she was very tired but no hospitalizations.  No interval falls.  She has no new complaints.    Medications:  Current Outpatient Prescriptions on File Prior to Visit  Medication Sig Dispense Refill  . Cholecalciferol (VITAMIN D) 2000 UNITS tablet Take 2,000 Units by mouth daily.    . metoprolol succinate (TOPROL-XL) 50 MG 24 hr tablet Take 50 mg by mouth every evening. Take with or immediately following a meal.    . Multiple Vitamins-Minerals (QC WOMENS DAILY MULTIVITAMIN PO) Take by mouth.    .Marland Kitchenomeprazole (PRILOSEC) 40  MG capsule Take 1 capsule (40 mg total) by mouth 2 (two) times daily. 60 capsule 5   No current facility-administered medications on file prior to visit.    Allergies: No Known Allergies   Review of Systems:  CONSTITUTIONAL: No fevers, chills, night sweats, or weight loss.   EYES: No visual changes or eye pain ENT: No hearing changes.  No history of nose bleeds.   RESPIRATORY: No cough,  wheezing and shortness of breath.   CARDIOVASCULAR: Negative for chest pain, and palpitations.   GI: Negative for abdominal discomfort, blood in stools or black stools.  No recent change in bowel habits.   GU:  No history of incontinence.   MUSCLOSKELETAL:+ history of joint pain or swelling.  +myalgias.   SKIN: Negative for lesions, rash, and itching.   ENDOCRINE: Negative for cold or heat intolerance, polydipsia or goiter.   PSYCH: No  depression or anxiety symptoms.   NEURO: As Above.   Vital Signs:  BP 130/84 mmHg  Pulse 90  Ht _0  (1.651 m)  Wt 228 lb 3 oz (103.505 kg)  BMI 37.97 kg/m2  SpO2 96%  Neurological Exam: MENTAL STATUS including orientation to time, place, person, recent and remote memory, attention span and concentration, language, and fund of knowledge is normal.  Speech is not dysarthric.  CRANIAL NERVES:  Pupils equal round and reactive to light.  Normal conjugate, extra-ocular eye movements in all directions of gaze.  No ptosis. Face is symmetric. Palate elevates symmetrically.  Tongue is midline.  MOTOR:  Motor strength is 5/5 in all extremities.  No pronator drift.  Tone is normal.    MSRs:  Reflexes are 2+/4 throughout.    COORDINATION/GAIT:   Gait narrow based and stable. No dysmetria.  Data: MRI brain wwo contrast 09/05/2013: Multiple foci of white matter T2 signal abnormality in both cerebral hemispheres and in the upper cervical spinal cord. Findings are nonspecific but most concerning for demyelinating disease. No abnormal enhancement to suggest active demyelination.   MRI cervical spine wwo contrast 09/14/2013: 1. Cervical MRI fails to confirm definite abnormal signal or enhancement within the cervical cord.  2. Small disc protrusions at C5-6 and C6-7 without cord deformity or foraminal compromise.  Labs 08/08/2013: WBC 7.7, Hgb 14.2, PLT 220, Na 140, K 4.6, Cr 0.66, AST 25, ALT 43*, TSH 1.6, ESR 3, vitamin B12 553, ANA neg  CSF 02/20/2014:  R0 W3 G107*  P26  IgG 1.3, oligoclonal bands absent, ACE 7, cytology negative  MRI brain and cervical spine wwo contrast 07/29/2014:   1. Question new 4 mm T2 lesion within the right cerebral peduncle.  This could represent progression of a demyelinating process. 2. Subcortical T2 hyperintensities are otherwise stable. 3. Stable left parietal occipital infarct. 4. Minimal spondylosis of the cervical spine without change. There is no significant cord signal abnormality within the cervical spine.  IMPRESSION: Ms. Buckhalter is a 49 year-old female returning for evaluation of a constellation of complaints (paresthesias, vision changes, ringing in the ear, myalgias, generalized fatigue) whose MRI brain showed white matter changes concerning for demyelinating disease. She underwent repeat imaging of the brain and cervical spine which shows a new right cerebral peduncle lesion.  With the progression of white matter changes, I do feel that she has multiple sclerosis.   CSF testing, however, did not support diagnosis of multiple sclerosis, and may be due to patient having inactive lesions.  She is not interesting in starting disease modifying agent and has remained stable  off therapy.  She due to have annual surveillance imaging.   PLAN/RECOMMENDATIONS:  1.  MRI brain and cervical spine wwo contrast 2.  Continue vitamin D to 4000 units daily 3.  Recommend posterior leg stretches and staying well hydrated for leg cramps  Return to clinic in 6 months   The duration of this appointment visit was 20 minutes of face-to-face time with the patient.  Greater than 50% of this time was spent in counseling, explanation of diagnosis, planning of further management, and coordination of care.   Thank you for allowing me to participate in patient's care.  If I can answer any additional questions, I would be pleased to do so.    Sincerely,    Donika K. Posey Pronto, DO

## 2015-09-04 NOTE — Patient Instructions (Signed)
1.  MRI brain and cervical spine wwo contrast 2.  Continue vitamin D to 4000 units daily 3.  Recommend posterior leg stretches and staying well hydrated for leg cramps  Return to clinic in 6 months

## 2015-09-28 ENCOUNTER — Ambulatory Visit
Admission: RE | Admit: 2015-09-28 | Discharge: 2015-09-28 | Disposition: A | Discharge status: Home / Self Care | Payer: Self-pay | Source: Ambulatory Visit | Attending: Neurology | Admitting: Neurology

## 2015-09-28 ENCOUNTER — Other Ambulatory Visit: Payer: Self-pay

## 2015-09-28 DIAGNOSIS — G35 Multiple sclerosis: Secondary | ICD-10-CM

## 2015-09-28 MED ORDER — GADOBENATE DIMEGLUMINE 529 MG/ML IV SOLN
20.0000 mL | Freq: Once | INTRAVENOUS | Status: AC | PRN
  Administered 2015-09-28: 20 mL via INTRAVENOUS

## 2015-11-27 ENCOUNTER — Telehealth: Payer: Self-pay | Admitting: Neurology

## 2015-11-27 NOTE — Telephone Encounter (Signed)
Handicapped form mailed.

## 2015-11-27 NOTE — Telephone Encounter (Signed)
Stephanie Webb 21-Nov-2065. She was calling regarding renewing her Handicapped sticker. She said it runs out this month. Her number is [phone]. Thank you

## 2015-12-15 ENCOUNTER — Telehealth: Payer: Self-pay | Admitting: *Deleted

## 2015-12-15 NOTE — Telephone Encounter (Signed)
Patient called back and is requesting a permanent handicapped placard.

## 2015-12-15 NOTE — Telephone Encounter (Signed)
OK to give

## 2015-12-16 NOTE — Telephone Encounter (Signed)
Patient notified that we will give her a permanent one.  Will mail to her per her request.

## 2016-01-18 ENCOUNTER — Telehealth: Payer: Self-pay | Admitting: Neurology

## 2016-01-18 NOTE — Telephone Encounter (Signed)
Patient states that she is having problems and would like to talk to someone. Please call [phone]

## 2016-01-20 NOTE — Telephone Encounter (Signed)
Patient was calling to let us know that she was walking Saturday and her ankle locked up.  She went to chiropractor and it feels much better and she can now walk on it.

## 2016-03-07 ENCOUNTER — Ambulatory Visit: Payer: Self-pay | Admitting: Neurology

## 2016-03-09 ENCOUNTER — Ambulatory Visit (INDEPENDENT_AMBULATORY_CARE_PROVIDER_SITE_OTHER): Payer: Medicaid Other | Admitting: Neurology

## 2016-03-09 ENCOUNTER — Encounter: Payer: Self-pay | Admitting: Neurology

## 2016-03-09 VITALS — BP 128/72 | HR 90 | Ht 65.0 in | Wt 227.0 lb

## 2016-03-09 DIAGNOSIS — R5382 Chronic fatigue, unspecified: Secondary | ICD-10-CM

## 2016-03-09 DIAGNOSIS — G4762 Sleep related leg cramps: Secondary | ICD-10-CM | POA: Diagnosis not present

## 2016-03-09 DIAGNOSIS — G35 Multiple sclerosis: Secondary | ICD-10-CM

## 2016-03-09 NOTE — Patient Instructions (Signed)
Start tonic water and leg stretches at bedtime Return to clinic in 6 months

## 2016-03-09 NOTE — Progress Notes (Signed)
Follow-up Visit   Date: 03/09/16    Stephanie Webb MRN: [id number] DOB: [birth year]   Interim History: Stephanie Webb is a 50 y.o. right-handed Caucasian female with history of hypertension, GERD, irritable bowel syndrome, and palpitations returning to the clinic for follow-up of multiple sclerosis.    History of present illness: She feels that she has multiple sclerosis because she complains of 20-year history of transient bilateral vision loss, double vision, generalized fatigue, numbness/tingling of the arms > legs, and muscle aches. All of her symptoms are fluctuating and occur for several days at a time about 3-4 times per year. She does not feel depressed. In July 2014, she says she had an episode of bilateral leg numbness and was unable to lift her legs. She did not go to the ED, but massaged her legs which resolved symptoms. Dilated eye exams has been normal.  She also reports having unrefreshed sleep, mild snoring, and waking up several times at night.   UPDATE 01/28/3014:  Patient underwent MRI brain which showed white matter changes involving the cerebral hemisphere and upper cervical cord, concerning for demyelinating disease.  No active disease.  She continues to have intermittent symptoms of vision changes, fatigue, and sensory changes.    UPDATE 03/12/2014:  Patient has CSF testing which was essentially normal, except elevated CSF glucose.  Particularly, there was no signs of inflammation.  Her primary complaints is ongoing problems with fatigue and excessive daytime sleepiness.  She reports taking a daily nap but still does not feel refreshed. She feels as if she could sleep all the time.  No associated cataplexy or hallucinations.  She says that her daughter has spells of sleep paralysis.  UPDATE 06/12/2013:  She did not get called to schedule sleep evaluation, but says that her fatigue has been improved since taking vitamin D.  She reports having a 6-week spell of tingling of the  legs and arm, nausea, headaches in November, but has been relatively well since then.    UPDATE 08/07/2014:   Two days ago, she reports being very fatigued and impaired thinking. She has multiple flares of fatigue lasting weeks, without provocation and always feels tired.  Vitamin D definitely helped her energy, but she can't find what she gets waves of generalized fatigue.  MRI brain performed on 07/29/2014 showed one new right cerebral peduncle lesion without enhancement.  UPDATE 04/03/2015:   She does not feel that there have been any new issues.  She feels that during the summer the walking was a little more difficult because of the warmer temperature. She is trying to eat well.  She did not take provigil because she was concerned about side effects with her beta-blockers and overall energy is good.  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  UPDATE 03/09/2016:  She has had a few spells of stumbling, but no new falls.  She reports trying some marijuana and immediately felt a boost of energy and clarity of thinking and would like to see if it is available to be prescribed.  It was explained that it is no legal in [place].  She complains of generalized fatigue, problems with concentration, and muscle cramps. She also endorses being under a lot of stress.  Medications:  Current Outpatient Prescriptions on File Prior to Visit  Medication Sig Dispense Refill  . Cholecalciferol (VITAMIN D) 2000 UNITS tablet Take 2,000 Units by mouth daily.    . metoprolol succinate (TOPROL-XL) 50  MG 24 hr tablet Take 50 mg by mouth every evening. Take with or immediately following a meal.    . Multiple Vitamins-Minerals (QC WOMENS DAILY MULTIVITAMIN PO) Take by mouth.    Marland Kitchen omeprazole (PRILOSEC) 40 MG capsule Take 1 capsule (40 mg total) by mouth 2 (two) times daily. 60 capsule 5   No current facility-administered medications on file prior to visit.     Allergies: No  Known Allergies   Review of Systems:  CONSTITUTIONAL: No fevers, chills, night sweats, or weight loss.   EYES: No visual changes or eye pain ENT: No hearing changes.  No history of nose bleeds.   RESPIRATORY: No cough, wheezing and shortness of breath.   CARDIOVASCULAR: Negative for chest pain, and palpitations.   GI: Negative for abdominal discomfort, blood in stools or black stools.  No recent change in bowel habits.   GU:  No history of incontinence.   MUSCLOSKELETAL:+ history of joint pain or swelling.  +myalgias.   SKIN: Negative for lesions, rash, and itching.   ENDOCRINE: Negative for cold or heat intolerance, polydipsia or goiter.   PSYCH: No  depression or anxiety symptoms.   NEURO: As Above.   Vital Signs:  BP 128/72   Pulse 90   Ht 5' 5" (1.651 m)   Wt 227 lb (103 kg)   BMI 37.77 kg/m   Neurological Exam: MENTAL STATUS including orientation to time, place, person, recent and remote memory, attention span and concentration, language, and fund of knowledge is normal.  Speech is not dysarthric.  CRANIAL NERVES:  Pupils equal round and reactive to light.  Normal conjugate, extra-ocular eye movements in all directions of gaze.  No ptosis. Face is symmetric. Palate elevates symmetrically.  Tongue is midline.  MOTOR:  Motor strength is 5/5 in all extremities.  No pronator drift.  Tone is normal.    MSRs:  Reflexes are 2+/4 throughout.    COORDINATION/GAIT:   Gait narrow based and stable. No dysmetria.  Data: MRI brain wwo contrast 09/05/2013: Multiple foci of white matter T2 signal abnormality in both cerebral hemispheres and in the upper cervical spinal cord. Findings are nonspecific but most concerning for demyelinating disease. No abnormal enhancement to suggest active demyelination.   MRI cervical spine wwo contrast 09/14/2013: 1. Cervical MRI fails to confirm definite abnormal signal or enhancement within the cervical cord.  2. Small disc protrusions at C5-6 and C6-7  without cord deformity or foraminal compromise.  Labs 08/08/2013: WBC 7.7, Hgb 14.2, PLT 220, Na 140, K 4.6, Cr 0.66, AST 25, ALT 43*, TSH 1.6, ESR 3, vitamin B12 553, ANA neg  CSF 02/20/2014:  R0 W3 G107* P26  IgG 1.3, oligoclonal bands absent, ACE 7, cytology negative  MRI brain and cervical spine wwo contrast 07/29/2014:   1. Question new 4 mm T2 lesion within the right cerebral peduncle.  This could represent progression of a demyelinating process. 2. Subcortical T2 hyperintensities are otherwise stable. 3. Stable left parietal occipital infarct. 4. Minimal spondylosis of the cervical spine without change. There is no significant cord signal abnormality within the cervical spine.  MRI brain and cervical wwo contrast 09/28/2015: 1. 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 No abnormal enhancement to suggest active demyelination. 2. No cervical spinal cord lesions identified. 3. Slightly increased size of C5-6 disc protrusion without stenosis.   IMPRESSION: Ms. Stephanie Webb is a 13 year-old female returning for evaluation of a constellation of complaints (paresthesias, vision changes, ringing in the ear, myalgias, generalized  fatigue) whose MRI brain showed white matter changes concerning for demyelinating disease. She underwent repeat imaging of the brain and cervical spine which shows a new right cerebral peduncle lesion in 2016.   With the progression of white matter changes, I do feel that she has multiple sclerosis.   CSF testing, however, did not support diagnosis of multiple sclerosis, and may be due to patient having inactive lesions.  She is not interesting in starting disease modifying agent and has remained stable off therapy. Surveillance imaging from 2017 has been stable.  Clinically, she complains of chronic fatigue, problems with concentration and memory, and muscle cramps.  We discussed starting Namenda to see if this helps with cognition and fatigue, but she  declined.  She was also offered provigil, but did not start this due to concerns with potential side effects.  PLAN/RECOMMENDATIONS:  1.  She does not wish to be on immunomodulatory therapy and remains stable 2.  Recommend trial of tonic water at bedtime and leg stretches 3.  Continue vitamin D to 4000 units daily 4.  She had many questions about medical marijuana and it was explained that it is not legal in [place] and I am not familiar with prescribing it  Return to clinic in 6 months   The duration of this appointment visit was 30 minutes of face-to-face time with the patient.  Greater than 50% of this time was spent in counseling, explanation of diagnosis, planning of further management, and coordination of care.   Thank you for allowing me to participate in patient's care.  If I can answer any additional questions, I would be pleased to do so.    Sincerely,     K. Posey Pronto, DO

## 2016-06-27 ENCOUNTER — Telehealth: Payer: Self-pay | Admitting: Neurology

## 2016-06-27 NOTE — Telephone Encounter (Signed)
[employer] left a message asking if you received medical release for records  on PT yet/[name] CB# [phone] REF# [id number]

## 2016-06-27 NOTE — Telephone Encounter (Signed)
Notified [employer] that I did receive request today.

## 2016-08-11 ENCOUNTER — Ambulatory Visit: Payer: Medicaid Other | Admitting: Neurology

## 2016-08-17 ENCOUNTER — Encounter: Payer: Self-pay | Admitting: Neurology

## 2016-08-17 ENCOUNTER — Ambulatory Visit (INDEPENDENT_AMBULATORY_CARE_PROVIDER_SITE_OTHER): Payer: Medicaid Other | Admitting: Neurology

## 2016-08-17 VITALS — BP 130/88 | HR 94 | Ht 65.0 in

## 2016-08-17 DIAGNOSIS — G35 Multiple sclerosis: Secondary | ICD-10-CM | POA: Diagnosis not present

## 2016-08-17 NOTE — Progress Notes (Signed)
Follow-up Visit   Date: 08/17/16    Jada Fass MRN: [id number] DOB: [birth year]   Interim History: Nicholl Onstott is a 51 y.o. right-handed Caucasian female with history of hypertension, GERD, irritable bowel syndrome, and palpitations returning to the clinic for follow-up of multiple sclerosis.    History of present illness: She feels that she has multiple sclerosis because she complains of 20-year history of transient bilateral vision loss, double vision, generalized fatigue, numbness/tingling of the arms > legs, and muscle aches. All of her symptoms are fluctuating and occur for several days at a time about 3-4 times per year. She does not feel depressed. In July 2014, she says she had an episode of bilateral leg numbness and was unable to lift her legs. She did not go to the ED, but massaged her legs which resolved symptoms. Dilated eye exams has been normal.  She also reports having unrefreshed sleep, mild snoring, and waking up several times at night. Patient underwent MRI brain which showed white matter changes involving the cerebral hemisphere and upper cervical cord, concerning for demyelinating disease.  No active disease.    UPDATE 03/12/2014:  Patient has CSF testing which was essentially normal, except elevated CSF glucose.  Particularly, there was no signs of inflammation.  Her primary complaints is ongoing problems with fatigue and excessive daytime sleepiness.  She reports taking a daily nap but still does not feel refreshed. She feels as if she could sleep all the time.  No associated cataplexy or hallucinations.  She says that her daughter has spells of sleep paralysis.  UPDATE 08/07/2014:   Two days ago, she reports being very fatigued and impaired thinking. She has multiple flares of fatigue lasting weeks, without provocation and always feels tired.  Vitamin D definitely helped her energy, but she can't find what she gets waves of generalized fatigue.  MRI brain performed on  07/29/2014 showed one new right cerebral peduncle lesion without enhancement.  UPDATE 03/09/2016:  She has had a few spells of stumbling, but no new falls.  She reports trying some marijuana and immediately felt a boost of energy and clarity of thinking and would like to see if it is available to be prescribed.  It was explained that it is no legal in [place].  She complains of generalized fatigue, problems with concentration, and muscle cramps. She also endorses being under a lot of stress.  UPDATE 08/17/2016:  Since January, she has had spells of leg weakness, lasting 15-20 minutes which occur every other day.  No bowel/bladder incontinence.  She had squeezing sensation of her abdomen and then developing a warm sensation over the legs.  She continues to have bilateral arm heaviness and fatigue during the day, but insomnia at night.  She is under a lot of stress related to work and is hopeful that once tax season is over, things will be better.  Medications:  Current Outpatient Prescriptions on File Prior to Visit  Medication Sig Dispense Refill  . busPIRone (BUSPAR) 10 MG tablet Take 10 mg by mouth daily.    . Cholecalciferol (VITAMIN D) 2000 UNITS tablet Take 2,000 Units by mouth daily.    . metoprolol succinate (TOPROL-XL) 50 MG 24 hr tablet Take 50 mg by mouth every evening. Take with or immediately following a meal.    . Multiple Vitamins-Minerals (QC WOMENS DAILY MULTIVITAMIN PO) Take by mouth.    Marland Kitchen omeprazole (PRILOSEC) 40 MG capsule Take 1 capsule (40 mg total) by mouth 2 (two) times  daily. 60 capsule 5   No current facility-administered medications on file prior to visit.     Allergies: No Known Allergies   Review of Systems:  CONSTITUTIONAL: No fevers, chills, night sweats, or weight loss.   EYES: No visual changes or eye pain ENT: No hearing changes.  No history of nose bleeds.   RESPIRATORY: No cough, wheezing and shortness of breath.   CARDIOVASCULAR: Negative for chest pain, and  palpitations.   GI: Negative for abdominal discomfort, blood in stools or black stools.  No recent change in bowel habits.   GU:  No history of incontinence.   MUSCLOSKELETAL:+ history of joint pain or swelling.  +myalgias.   SKIN: Negative for lesions, rash, and itching.   ENDOCRINE: Negative for cold or heat intolerance, polydipsia or goiter.   PSYCH: No  depression or anxiety symptoms.   NEURO: As Above.   Vital Signs:  BP 130/88   Pulse 94   Ht '5\' 5"'$  (1.651 m)   SpO2 97%   Neurological Exam: MENTAL STATUS including orientation to time, place, person, recent and remote memory, attention span and concentration, language, and fund of knowledge is normal.  Speech is not dysarthric.  CRANIAL NERVES:  Pupils equal round and reactive to light.  Normal conjugate, extra-ocular eye movements in all directions of gaze.  No ptosis. Face is symmetric. Palate elevates symmetrically.  Tongue is midline.  MOTOR:  Motor strength is 5/5 in all extremities, mild give-way weakness.  No pronator drift.  Tone is normal.    MSRs:  Reflexes are 2+/4 throughout.   SENSORY:  No sensory level.  Vibration intact throughout.    COORDINATION/GAIT:   Gait is slow and cautious, unassisted.  No dysmetria.  Data: MRI brain wwo contrast 09/05/2013: Multiple foci of white matter T2 signal abnormality in both cerebral hemispheres and in the upper cervical spinal cord. Findings are nonspecific but most concerning for demyelinating disease. No abnormal enhancement to suggest active demyelination.   MRI cervical spine wwo contrast 09/14/2013: 1. Cervical MRI fails to confirm definite abnormal signal or enhancement within the cervical cord.  2. Small disc protrusions at C5-6 and C6-7 without cord deformity or foraminal compromise.  Labs 08/08/2013: WBC 7.7, Hgb 14.2, PLT 220, Na 140, K 4.6, Cr 0.66, AST 25, ALT 43*, TSH 1.6, ESR 3, vitamin B12 553, ANA neg  CSF 02/20/2014:  R0 W3 G107* P26  IgG 1.3, oligoclonal bands  absent, ACE 7, cytology negative  MRI brain and cervical spine wwo contrast 07/29/2014:   1. Question new 4 mm T2 lesion within the right cerebral peduncle.  This could represent progression of a demyelinating process. 2. Subcortical T2 hyperintensities are otherwise stable. 3. Stable left parietal occipital infarct. 4. Minimal spondylosis of the cervical spine without change. There is no significant cord signal abnormality within the cervical spine.  MRI brain and cervical wwo contrast 09/28/2015: 1. 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 No abnormal enhancement to suggest active demyelination. 2. No cervical spinal cord lesions identified. 3. Slightly increased size of C5-6 disc protrusion without stenosis.   IMPRESSION: Ms. Melito is a 51 year-old female returning for relapsing remitting multiple sclerosis.  Today, she complains of spells of band-like sensation over the abdomen with paresthesias involving both legs.  She is due for annual surveillance imaging and will add MRI thoracic spine to assess for cord involvement at this level.  Imaging from 2016 showed a new right cerebral peduncle lesion in 2016.   Surveillance  imaging from 2017 has been stable.  She has not been interested in starting disease modifying agent in the past, but does have some interest to engage a conversation about medications today. We discussed her options for disease-modifying therapies including Copaxone, Tecfidera, and Tysabri.  I do not feel that she is a candidate for Gilenya due to cardiac arrhyhtmia.  She was given literature on the agents and will review it. All questions were answered.   PLAN/RECOMMENDATIONS:  1.  MRI brain, cervical, thoracic spine with and without contrast 2.  Literature provided on DMTs 3.  Continue vitamin D 4000 units daily  Return to clinic in 4 months, or sooner if there are new changes on her MRI    The duration of this appointment visit was 30 minutes of  face-to-face time with the patient.  Greater than 50% of this time was spent in counseling, explanation of diagnosis, planning of further management, and coordination of care.   Thank you for allowing me to participate in patient's care.  If I can answer any additional questions, I would be pleased to do so.    Sincerely,    Tyreak Reagle K. Posey Pronto, DO

## 2016-08-17 NOTE — Patient Instructions (Addendum)
1.  MRI brain, cervical, thoracic spine with and without contrast 2.  Information provided for Tecfidera and Copaxone  Return to clinic in 4 months.  If there are any changes on your MRI, we will see you sooner to discuss medications.

## 2016-09-02 ENCOUNTER — Ambulatory Visit
Admission: RE | Admit: 2016-09-02 | Discharge: 2016-09-02 | Disposition: A | Discharge status: Home / Self Care | Payer: Medicaid Other | Source: Ambulatory Visit | Attending: Neurology | Admitting: Neurology

## 2016-09-02 DIAGNOSIS — G35 Multiple sclerosis: Secondary | ICD-10-CM

## 2016-09-02 MED ORDER — GADOBENATE DIMEGLUMINE 529 MG/ML IV SOLN
20.0000 mL | Freq: Once | INTRAVENOUS | Status: AC | PRN
  Administered 2016-09-02: 20 mL via INTRAVENOUS

## 2016-09-05 ENCOUNTER — Other Ambulatory Visit: Payer: Medicaid Other

## 2016-09-05 ENCOUNTER — Telehealth: Payer: Self-pay | Admitting: *Deleted

## 2016-09-05 NOTE — Telephone Encounter (Signed)
Attempted to contact patient  No voicemail and no answer.  Will try again later.

## 2016-09-05 NOTE — Telephone Encounter (Signed)
-----   Message from Alda Berthold, DO sent at 09/05/2016  8:14 AM EDT ----- Please inform patient that her MRI brain is stable, no new MS plaques and nothing active.  Cervical spine is normal and unaffected. Please see if she has decided on starting medication for multiple sclerosis.   Thanks.

## 2016-09-06 ENCOUNTER — Telehealth: Payer: Self-pay | Admitting: *Deleted

## 2016-09-06 NOTE — Telephone Encounter (Signed)
-----   Message from Alda Berthold, DO sent at 09/05/2016  8:14 AM EDT ----- Please inform patient that her MRI brain is stable, no new MS plaques and nothing active.  Cervical spine is normal and unaffected. Please see if she has decided on starting medication for multiple sclerosis.   Thanks.

## 2016-09-06 NOTE — Telephone Encounter (Signed)
Patient given results.  She had to r/s the L-spine MRI due to the flu.  She will have it done at the end of April and then decide on whether or not to start medication.

## 2016-09-07 ENCOUNTER — Ambulatory Visit: Payer: Medicaid Other | Admitting: Neurology

## 2016-09-26 ENCOUNTER — Other Ambulatory Visit: Payer: Medicaid Other

## 2016-10-19 ENCOUNTER — Ambulatory Visit
Admission: RE | Admit: 2016-10-19 | Discharge: 2016-10-19 | Disposition: A | Discharge status: Home / Self Care | Payer: Medicaid Other | Source: Ambulatory Visit | Attending: Neurology | Admitting: Neurology

## 2016-10-19 DIAGNOSIS — G35 Multiple sclerosis: Secondary | ICD-10-CM

## 2016-10-19 MED ORDER — GADOBENATE DIMEGLUMINE 529 MG/ML IV SOLN
20.0000 mL | Freq: Once | INTRAVENOUS | Status: AC | PRN
  Administered 2016-10-19: 20 mL via INTRAVENOUS

## 2016-10-21 ENCOUNTER — Telehealth: Payer: Self-pay | Admitting: Neurology

## 2016-10-21 NOTE — Telephone Encounter (Signed)
Called and informed patient of the results of her MRI thoracic spine which shows 1 cm area of cord hyperintensity at T10 without enhancement.  No prior imaging to compare with, so it is difficult to determine the age of this lesion.  Currently, she reports doing well and does not wish to start any DMTs.  We can discuss this again at her f/u in July.  Raney Koeppen K. Posey Pronto, DO

## 2016-12-19 ENCOUNTER — Ambulatory Visit: Payer: Medicaid Other | Admitting: Neurology

## 2016-12-30 ENCOUNTER — Ambulatory Visit: Payer: Medicaid Other | Admitting: Neurology

## 2016-12-30 ENCOUNTER — Telehealth: Payer: Self-pay | Admitting: Neurology

## 2016-12-30 NOTE — Telephone Encounter (Signed)
Patient left a voice mail stating she need to talk to a nurse did not say why

## 2016-12-30 NOTE — Telephone Encounter (Signed)
Patient said that she has been vomiting all day.  She wanted to know what to do.  Instructed her that she may want to go to urgent care since she may be getting dehydrated.

## 2017-01-06 ENCOUNTER — Encounter: Payer: Self-pay | Admitting: Neurology

## 2017-01-13 ENCOUNTER — Encounter: Payer: Self-pay | Admitting: Neurology

## 2017-01-13 ENCOUNTER — Ambulatory Visit (INDEPENDENT_AMBULATORY_CARE_PROVIDER_SITE_OTHER): Payer: Medicaid Other | Admitting: Neurology

## 2017-01-13 VITALS — BP 120/88 | HR 85 | Ht 65.0 in | Wt 213.0 lb

## 2017-01-13 DIAGNOSIS — G35 Multiple sclerosis: Secondary | ICD-10-CM | POA: Diagnosis not present

## 2017-01-13 NOTE — Progress Notes (Signed)
Follow-up Visit   Date: 01/13/17    Manju Kulkarni MRN: [id number] DOB: May 06, 1966   Interim History: Lance Galas is a 51 y.o. right-handed Caucasian female with history of hypertension, GERD, irritable bowel syndrome, and palpitations returning to the clinic for follow-up of multiple sclerosis.     History of present illness: She feels that she has multiple sclerosis because she of history of transient bilateral vision loss, double vision, generalized fatigue, numbness/tingling of the arms > legs, and muscle aches since the late 1990s. All of her symptoms are fluctuating and occur for several days at a time about 3-4 times per year.  In July 2014, she says she had an episode of bilateral leg numbness and was unable to lift her legs. She did not go to the ED. Dilated eye exams has been normal.  She also reports having unrefreshed sleep, mild snoring, and waking up several times at night. Patient underwent MRI brain which showed white matter changes involving the cerebral hemisphere and upper cervical cord, concerning for demyelinating disease.  No active disease.  CSF testing was essentially normal, except elevated CSF glucose.  Particularly, there was no signs of inflammation.  MRI brain performed on 07/29/2014 showed one new right cerebral peduncle lesion without enhancement.  UPDATE 03/09/2016:  She has had a few spells of stumbling, but no new falls.  She reports trying some marijuana and immediately felt a boost of energy and clarity of thinking and would like to see if it is available to be prescribed.  It was explained that it is no legal in [place].  She complains of generalized fatigue, problems with concentration, and muscle cramps. She also endorses being under a lot of stress.  UPDATE 08/17/2016:  Since January, she has had spells of leg weakness, lasting 15-20 minutes which occur every other day.  No bowel/bladder incontinence.  She had squeezing sensation of her abdomen and then  developing a warm sensation over the legs.  She continues to have bilateral arm heaviness and fatigue during the day, but insomnia at night.  She is under a lot of stress related to work and is hopeful that once tax season is over, things will be better.  UPDATE 01/13/2017:  She is here for follow-up for MS.  She suffered two falls in June because of legs feeling numb and tried to slow down her physical activity.  She was able to stand without help.  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  She is filing for disability.  MRI thoracic spine which shows 1 cm area of cord hyperintensity at T10 without enhancement.  No prior imaging to compare with, so it is difficult to determine the age of this lesion.  Medications:  Current Outpatient Prescriptions on File Prior to Visit  Medication Sig Dispense Refill  . busPIRone (BUSPAR) 10 MG tablet Take 10 mg by mouth daily.    . Cholecalciferol (VITAMIN D) 2000 UNITS tablet Take 2,000 Units by mouth daily.    . metoprolol succinate (TOPROL-XL) 50 MG 24 hr tablet Take 50 mg by mouth every evening. Take with or immediately following a meal.    . Multiple Vitamins-Minerals (QC WOMENS DAILY MULTIVITAMIN PO) Take by mouth.    Marland Kitchen omeprazole (PRILOSEC) 40 MG capsule Take 1 capsule (40 mg total) by mouth 2 (two) times daily. 60 capsule 5   No current facility-administered medications on file prior to visit.     Allergies:  Allergies  Allergen Reactions  . Banana Anaphylaxis  . Cantaloupe Extract Allergy Skin Test Anaphylaxis  . Cherry Anaphylaxis  . Citrullus Vulgaris Anaphylaxis  . Red Dye   . Sulfa Antibiotics Itching     Review of Systems:  CONSTITUTIONAL: No fevers, chills, night sweats, or weight loss.   EYES: No visual changes or eye pain ENT: No hearing changes.  No history of nose bleeds.   RESPIRATORY: No cough, wheezing and shortness of breath.   CARDIOVASCULAR: Negative for chest pain, and palpitations.     GI: Negative for abdominal discomfort, blood in stools or black stools.  No recent change in bowel habits.   GU:  No history of incontinence.   MUSCLOSKELETAL:+ history of joint pain or swelling.  +myalgias.   SKIN: Negative for lesions, rash, and itching   ENDOCRINE: Negative for cold or heat intolerance, polydipsia or goiter.   PSYCH: No depression or anxiety symptoms.   NEURO: As Above.   Vital Signs:  BP 120/88   Pulse 85   Ht '5\' 5"'$  (1.651 m)   Wt 213 lb (96.6 kg)   SpO2 98%   BMI 35.45 kg/m   Neurological Exam: MENTAL STATUS including orientation to time, place, person, recent and remote memory, attention span and concentration, language, and fund of knowledge is normal.  Speech is not dysarthric.  CRANIAL NERVES:  Pupils equal round and reactive to light. Normal conjugate, extra-ocular eye movements in all directions of gaze.  No ptosis. Face is symmetric. Palate elevates symmetrically.  Tongue is midline.  MOTOR:  Motor strength is 5/5 in all extremities. No pronator drift.  Tone is normal.    MSRs:  Reflexes are 2+/4 throughout.   SENSORY:  Vibration intact throughout.    COORDINATION/GAIT:  She is able to stand up from low chair without pushing off.  Gait is normal.  Stressed and tandem gait. No dysmetria.  Data: MRI brain wwo contrast 09/05/2013: Multiple foci of white matter T2 signal abnormality in both cerebral hemispheres and in the upper cervical spinal cord. Findings are nonspecific but most concerning for demyelinating disease. No abnormal enhancement to suggest active demyelination.   MRI cervical spine wwo contrast 09/14/2013: 1. Cervical MRI fails to confirm definite abnormal signal or enhancement within the cervical cord.  2. Small disc protrusions at C5-6 and C6-7 without cord deformity or foraminal compromise.  Labs 08/08/2013: WBC 7.7, Hgb 14.2, PLT 220, Na 140, K 4.6, Cr 0.66, AST 25, ALT 43*, TSH 1.6, ESR 3, vitamin B12 553, ANA neg  CSF 02/20/2014:  R0  W3 G107* P26  IgG 1.3, oligoclonal bands absent, ACE 7, cytology negative  MRI brain and cervical spine wwo contrast 07/29/2014:   1. Question new 4 mm T2 lesion within the right cerebral peduncle.  This could represent progression of a demyelinating process. 2. Subcortical T2 hyperintensities are otherwise stable. 3. Stable left parietal occipital infarct. 4. Minimal spondylosis of the cervical spine without change. There is no significant cord signal abnormality within the cervical spine.  MRI brain and cervical wwo contrast 09/28/2015: 1. 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 No abnormal enhancement to suggest active demyelination. 2. No cervical spinal cord lesions identified. 3. Slightly increased size of C5-6 disc protrusion without stenosis.  MRI thoracic spine wwo contrast 10/19/2016: 1 cm area of cord hyperintensity at T10 without enhancement. This is most consistent with multiple sclerosis. No other cord lesions Moderate disc degeneration at T11-12 without spinal stenosis.   IMPRESSION: Ms. Bennis  is a 51 year-old female returning for relapsing remitting multiple sclerosis not on DMTs.  Her recent MRI thoracic spine from May 2018 showed T10 hyperintensity without enhancement.  In 2016, there was a new right cerebral peduncle lesion in 2016.  We have discussed disease modifying therapies in the patient, and she does not have keen interest in this and will be followed clinically.  PLAN/RECOMMENDATIONS:  1.  Recommend annual eye exam 2.  Continue vitamin D 4000 units daily 3.  Continue magnesium oxide 470m for cramps as needed 4.  I have encouraged her to stay active  Return to clinic in 6 months   The duration of this appointment visit was 20 minutes of face-to-face time with the patient.  Greater than 50% of this time was spent in counseling, explanation of diagnosis, planning of further management, and coordination of care.   Thank you for allowing  me to participate in patient's care.  If I can answer any additional questions, I would be pleased to do so.    Sincerely,    Donika K. PPosey Pronto DO

## 2017-01-13 NOTE — Patient Instructions (Signed)
Return to clinic in 6 months.

## 2017-07-19 ENCOUNTER — Ambulatory Visit: Payer: Medicaid Other | Admitting: Neurology

## 2017-07-19 ENCOUNTER — Encounter: Payer: Self-pay | Admitting: Neurology

## 2017-07-19 VITALS — BP 110/80 | HR 83 | Ht 65.0 in | Wt 214.5 lb

## 2017-07-19 DIAGNOSIS — G35 Multiple sclerosis: Secondary | ICD-10-CM

## 2017-07-19 NOTE — Patient Instructions (Addendum)
Plan to repeat MRI brain, cervical, and thoracic spine in May.  Please call us in April, so we can schedule this  Return to clinic in 6 months

## 2017-07-19 NOTE — Progress Notes (Signed)
Follow-up Visit   Date: 07/19/17    Stephanie Webb MRN: [id number] DOB: [birth year]   Interim History: Stephanie Webb is a 52 y.o. right-handed Caucasian female with history of hypertension, GERD, irritable bowel syndrome, and palpitations returning to the clinic for follow-up of multiple sclerosis.     History of present illness: She feels that she has multiple sclerosis because she of history of transient bilateral vision loss, double vision, generalized fatigue, numbness/tingling of the arms > legs, and muscle aches since the late 1990s. All of her symptoms are fluctuating and occur for several days at a time about 3-4 times per year.  In July 2014, she says she had an episode of bilateral leg numbness and was unable to lift her legs. She did not go to the ED. Dilated eye exams has been normal.  She also reports having unrefreshed sleep, mild snoring, and waking up several times at night. Patient underwent MRI brain which showed white matter changes involving the cerebral hemisphere and upper cervical cord, concerning for demyelinating disease.  No active disease.  CSF testing was essentially normal, except elevated CSF glucose.  Particularly, there was no signs of inflammation.  MRI brain performed on 07/29/2014 showed one new right cerebral peduncle lesion without enhancement.  UPDATE 03/09/2016:  She has had a few spells of stumbling, but no new falls.  She reports trying some marijuana and immediately felt a boost of energy and clarity of thinking and would like to see if it is available to be prescribed.  It was explained that it is no legal in [place].  She complains of generalized fatigue, problems with concentration, and muscle cramps. She also endorses being under a lot of stress.  UPDATE 08/17/2016:  Since January, she has had spells of leg weakness, lasting 15-20 minutes which occur every other day.  No bowel/bladder incontinence.  She had squeezing sensation of her abdomen and then  developing a warm sensation over the legs.  She continues to have bilateral arm heaviness and fatigue during the day, but insomnia at night.  She is under a lot of stress related to work and is hopeful that once tax season is over, things will be better.  UPDATE 01/13/2017:  She is here for follow-up for MS.  She suffered two falls in June because of legs feeling numb and tried to slow down her physical activity.  She was able to stand without help.  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  She is filing for disability.  MRI thoracic spine which shows 1 cm area of cord hyperintensity at T10 without enhancement.  No prior imaging to compare with, so it is difficult to determine the age of this lesion.  UPDATE 07/19/2017:  She is here for 6 months.  She has been doing well without any new neurological complaints; she continues to have generalized numbness and tingling of the arms and legs, but this is not new.  The tingling in her legs due to thoracic cord lesion has significantly improved.  She suffered one mechanical fall.  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    Medications:  Current Outpatient Medications on File Prior to Visit  Medication Sig Dispense Refill  . busPIRone (BUSPAR) 10 MG tablet Take 10 mg by mouth daily as needed.     Marland Kitchen  Cholecalciferol (VITAMIN D) 2000 UNITS tablet Take 2,000 Units by mouth daily.    . metoprolol succinate (TOPROL-XL) 50 MG 24 hr tablet Take 50 mg by mouth every evening. Take with or immediately following a meal.    . Multiple Vitamins-Minerals (QC WOMENS DAILY MULTIVITAMIN PO) Take by mouth.    . omeprazole (PRILOSEC) 40 MG capsule Take 1 capsule (40 mg total) by mouth 2 (two) times daily. 60 capsule 5   No current facility-administered medications on file prior to visit.     Allergies:  Allergies  Allergen Reactions    . Banana Anaphylaxis  . Cantaloupe Extract Allergy Skin Test Anaphylaxis  . Cherry Anaphylaxis  . Citrullus Vulgaris Anaphylaxis  . Red Dye   . Sulfa Antibiotics Itching     Review of Systems:  CONSTITUTIONAL: No fevers, chills, night sweats, or weight loss.   EYES: No visual changes or eye pain ENT: No hearing changes.  No history of nose bleeds.   RESPIRATORY: No cough, wheezing and shortness of breath.   CARDIOVASCULAR: Negative for chest pain, and palpitations.   GI: Negative for abdominal discomfort, blood in stools or black stools.  No recent change in bowel habits.   GU:  No history of incontinence.   MUSCLOSKELETAL:+ history of joint pain or swelling.  +myalgias.   SKIN: Negative for lesions, rash, and itching   ENDOCRINE: Negative for cold or heat intolerance, polydipsia or goiter.   PSYCH: No depression or anxiety symptoms.   NEURO: As Above.   Vital Signs:  BP 110/80   Pulse 83   Ht 5' 5" (1.651 m)   Wt 214 lb 8 oz (97.3 kg)   SpO2 97%   BMI 35.69 kg/m   General Medical Exam:   General:  Well appearing, comfortable  Eyes/ENT: see cranial nerve examination.   Neck: No carotid bruits. Respiratory:  Clear to auscultation, good air entry bilaterally.   Cardiac:  Regular rate and rhythm, no murmur.   Ext: No edema  Neurological Exam: MENTAL STATUS including orientation to time, place, person, recent and remote memory, attention span and concentration, language, and fund of knowledge is normal.  Speech is not dysarthric.  CRANIAL NERVES: No visual field defects. Normal fundoscopic exam.  Pupils equal round and reactive to light.  Normal conjugate, extra-ocular eye movements in all directions of gaze.  No ptosis. Normal facial sensation.  Face is symmetric. Palate elevates symmetrically.  Tongue is midline.  MOTOR:  Motor strength is 5/5 in all extremities.  No atrophy, fasciculations or abnormal movements.  No pronator drift.  Tone is normal.    MSRs:  Reflexes  are 2+/4 throughout.  SENSORY:  Intact to vibration throughout.  COORDINATION/GAIT:  Normal finger-to- nose-finger and heel-to-shin.  Intact rapid alternating movements bilaterally.  Gait narrow based and stable.    Data: MRI brain wwo contrast 09/05/2013: Multiple foci of white matter T2 signal abnormality in both cerebral hemispheres and in the upper cervical spinal cord. Findings are nonspecific but most concerning for demyelinating disease. No abnormal enhancement to suggest active demyelination.   MRI cervical spine wwo contrast 09/14/2013: 1. Cervical MRI fails to confirm definite abnormal signal or enhancement within the cervical cord.  2. Small disc protrusions at C5-6 and C6-7 without cord deformity or foraminal compromise.  Labs 08/08/2013: WBC 7.7, Hgb 14.2, PLT 220, Na 140, K 4.6, Cr 0.66, AST 25, ALT 43*, TSH 1.6, ESR 3, vitamin B12 553, ANA neg  CSF 02/20/2014:  R0 W3 G107* P26    IgG 1.3, oligoclonal bands absent, ACE 7, cytology negative  MRI brain and cervical spine wwo contrast 07/29/2014:   1. Question new 4 mm T2 lesion within the right cerebral peduncle.  This could represent progression of a demyelinating process. 2. Subcortical T2 hyperintensities are otherwise stable. 3. Stable left parietal occipital infarct. 4. Minimal spondylosis of the cervical spine without change. There is no significant cord signal abnormality within the cervical spine.  MRI brain and cervical wwo contrast 09/28/2015: 1. 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 No abnormal enhancement to suggest active demyelination. 2. No cervical spinal cord lesions identified. 3. Slightly increased size of C5-6 disc protrusion without stenosis.  MRI thoracic spine wwo contrast 10/19/2016: 1 cm area of cord hyperintensity at T10 without enhancement. This is most consistent with multiple sclerosis. No other cord lesions Moderate disc degeneration at T11-12 without spinal  stenosis.   IMPRESSION: Ms. Maggio is a 52 year-old female returning for relapsing remitting multiple sclerosis not on DMT.  Clinically, she is doing well without any new neurological symptoms.  Again, I discussed the role of disease modifying therapies to reduce risk of disease progression, and she has elected to stay off medications.  Her MRI thoracic spine from May 2018 was viewed with patient and shows T10 hyperintensity without enhancement and in 2016, there was a new right cerebral peduncle lesion in 2016.  PLAN/RECOMMENDATIONS:  1.  Continue to follow clinically 2.  Continue vitamin D 4000U daily 3.  Repeat MRI brain, cervical spine, and thoracic spine wwo contrast in May, prior to next f/u 4.  Recommend annual eye exam  Return to clinic in 6 months   Thank you for allowing me to participate in patient's care.  If I can answer any additional questions, I would be pleased to do so.    Sincerely,    Brodin Gelpi K. Posey Pronto, DO

## 2017-10-04 ENCOUNTER — Other Ambulatory Visit: Payer: Self-pay | Admitting: Family Medicine

## 2017-10-04 DIAGNOSIS — R140 Abdominal distension (gaseous): Secondary | ICD-10-CM

## 2017-10-04 DIAGNOSIS — R1011 Right upper quadrant pain: Secondary | ICD-10-CM

## 2017-10-09 ENCOUNTER — Telehealth: Payer: Self-pay | Admitting: Neurology

## 2017-10-09 NOTE — Telephone Encounter (Signed)
Patient states that she needs appt for her MRI please call

## 2017-10-10 ENCOUNTER — Other Ambulatory Visit: Payer: Self-pay | Admitting: *Deleted

## 2017-10-10 DIAGNOSIS — G35 Multiple sclerosis: Secondary | ICD-10-CM

## 2017-10-10 NOTE — Telephone Encounter (Signed)
Patient notified MRI referrals have been sent.

## 2017-10-13 ENCOUNTER — Ambulatory Visit: Payer: Medicaid Other

## 2017-10-31 ENCOUNTER — Ambulatory Visit
Admission: RE | Admit: 2017-10-31 | Discharge: 2017-10-31 | Disposition: A | Discharge status: Home / Self Care | Payer: Medicaid Other | Source: Ambulatory Visit | Attending: Neurology | Admitting: Neurology

## 2017-10-31 DIAGNOSIS — G35 Multiple sclerosis: Secondary | ICD-10-CM

## 2017-10-31 MED ORDER — GADOBENATE DIMEGLUMINE 529 MG/ML IV SOLN
20.0000 mL | Freq: Once | INTRAVENOUS | Status: AC | PRN
  Administered 2017-10-31: 20 mL via INTRAVENOUS

## 2017-11-01 ENCOUNTER — Telehealth: Payer: Self-pay | Admitting: *Deleted

## 2017-11-01 NOTE — Telephone Encounter (Signed)
-----   Message from Alda Berthold, DO sent at 11/01/2017 10:42 AM EDT ----- Please inform patient that her MRI brain shows stable and mild disease, no new lesions.  MRI of the cervical spine is normal.  Thanks.

## 2017-11-01 NOTE — Telephone Encounter (Signed)
Patient given results

## 2017-11-07 ENCOUNTER — Encounter: Payer: Self-pay | Admitting: *Deleted

## 2017-11-07 ENCOUNTER — Telehealth: Payer: Self-pay | Admitting: *Deleted

## 2017-11-07 ENCOUNTER — Ambulatory Visit
Admission: RE | Admit: 2017-11-07 | Discharge: 2017-11-07 | Disposition: A | Discharge status: Home / Self Care | Payer: Medicaid Other | Source: Ambulatory Visit | Attending: Neurology | Admitting: Neurology

## 2017-11-07 DIAGNOSIS — G35 Multiple sclerosis: Secondary | ICD-10-CM

## 2017-11-07 MED ORDER — GADOBENATE DIMEGLUMINE 529 MG/ML IV SOLN
20.0000 mL | Freq: Once | INTRAVENOUS | Status: AC | PRN
  Administered 2017-11-07: 20 mL via INTRAVENOUS

## 2017-11-07 NOTE — Telephone Encounter (Signed)
-----   Message from Alda Berthold, DO sent at 11/07/2017  1:38 PM EDT ----- Please inform patient that her MRI thoracic spine looks good and is stable.  There are no new lesions.  Thanks.

## 2017-11-07 NOTE — Telephone Encounter (Signed)
Attempted to contact patient.  No answer and voicemail is not set up.  Will send letter.

## 2017-11-24 ENCOUNTER — Ambulatory Visit
Admission: RE | Admit: 2017-11-24 | Discharge: 2017-11-24 | Disposition: A | Discharge status: Home / Self Care | Payer: Medicaid Other | Source: Ambulatory Visit | Attending: Family Medicine | Admitting: Family Medicine

## 2017-11-24 DIAGNOSIS — R1011 Right upper quadrant pain: Secondary | ICD-10-CM

## 2017-11-24 DIAGNOSIS — N200 Calculus of kidney: Secondary | ICD-10-CM | POA: Diagnosis not present

## 2017-11-24 DIAGNOSIS — R140 Abdominal distension (gaseous): Secondary | ICD-10-CM

## 2017-11-24 DIAGNOSIS — K7689 Other specified diseases of liver: Secondary | ICD-10-CM | POA: Insufficient documentation

## 2018-01-17 ENCOUNTER — Ambulatory Visit: Payer: Medicaid Other | Admitting: Neurology

## 2018-03-21 ENCOUNTER — Encounter: Payer: Self-pay | Admitting: Neurology

## 2018-03-21 ENCOUNTER — Other Ambulatory Visit: Payer: Self-pay

## 2018-03-21 ENCOUNTER — Ambulatory Visit: Payer: Self-pay | Admitting: Neurology

## 2018-03-21 VITALS — BP 120/84 | HR 73 | Ht 65.0 in | Wt 210.0 lb

## 2018-03-21 DIAGNOSIS — G35 Multiple sclerosis: Secondary | ICD-10-CM

## 2018-03-21 NOTE — Patient Instructions (Signed)
Return to clinic in 6 months.

## 2018-03-21 NOTE — Progress Notes (Signed)
Follow-up Visit   Date: 03/21/18    Stephanie Webb MRN: [id number] DOB: 06/18/1965   Interim History: Stephanie Webb is a 52 y.o. right-handed Caucasian female with history of hypertension, GERD, irritable bowel syndrome, and palpitations returning to the clinic for follow-up of multiple sclerosis.     History of present illness: She has history of transient bilateral vision loss, double vision, generalized fatigue, numbness/tingling of the arms > legs, and muscle aches since the late 1990s. All of her symptoms are fluctuating and occur for several days at a time about 3-4 times per year.  In July 2014, she says she had an episode of bilateral leg numbness and was unable to lift her legs. She did not go to the ED. Dilated eye exams has been normal. Patient underwent MRI brain which showed white matter changes involving the cerebral hemisphere and upper cervical cord, concerning for demyelinating disease.  No active disease.  CSF testing was essentially normal, except elevated CSF glucose.  Particularly, there was no signs of inflammation.  MRI brain performed on 07/29/2014 showed one new right cerebral peduncle lesion without enhancement. Over the years, she has continued to have spells of numbness/tingling of the arms and legs and fatigue.  In 2018, MRI thoracic spine which shows 1 cm area of cord hyperintensity at T10 without enhancement.  No prior imaging to compare with, so it is difficult to determine the age of this lesion.  UPDATE 07/19/2017: She continues to have generalized numbness and tingling of the arms and legs, but this is not new.  The tingling in her legs due to thoracic cord lesion has significantly improved.  She suffered one mechanical fall.  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    UPDATE 03/21/2018:  She is here for follow-up visit.  She doing well without any new  neurological complaints.  Her MRI brain shows stable cerebral disease and improved T10 lesion.  No cervical cord abnormalities.  No interval illnesses, hospitalizations, or falls.  Medications:  Current Outpatient Medications on File Prior to Visit  Medication Sig Dispense Refill  . busPIRone (BUSPAR) 10 MG tablet Take 10 mg by mouth daily as needed.     . Cholecalciferol (VITAMIN D) 2000 UNITS tablet Take 2,000 Units by mouth daily.    . metoprolol succinate (TOPROL-XL) 50 MG 24 hr tablet Take 50 mg by mouth every evening. Take with or immediately following a meal.    . Multiple Vitamins-Minerals (QC WOMENS DAILY MULTIVITAMIN PO) Take by mouth.    Marland Kitchen omeprazole (PRILOSEC) 40 MG capsule Take 1 capsule (40 mg total) by mouth 2 (two) times daily. 60 capsule 5   No current facility-administered medications on file prior to visit.     Allergies:  Allergies  Allergen Reactions  . Banana Anaphylaxis  . Cantaloupe Extract Allergy Skin Test Anaphylaxis  . Cherry Anaphylaxis  . Citrullus Vulgaris Anaphylaxis  . Red Dye   . Sulfa Antibiotics Itching     Review of Systems:  CONSTITUTIONAL: No fevers, chills, night sweats, or weight loss.   EYES: No visual changes or eye pain ENT: No hearing changes.  No history of nose bleeds.   RESPIRATORY: No cough, wheezing and shortness of breath.   CARDIOVASCULAR: Negative for chest pain, and palpitations.   GI: Negative for abdominal discomfort, blood in stools or black stools.  No recent change  in bowel habits.   GU:  No history of incontinence.   MUSCLOSKELETAL:+ history of joint pain or swelling.  +myalgias.   SKIN: Negative for lesions, rash, and itching   ENDOCRINE: Negative for cold or heat intolerance, polydipsia or goiter.   PSYCH: No depression or anxiety symptoms.   NEURO: As Above.   Vital Signs:  BP 120/84 (BP Location: Right Arm, Patient Position: Sitting, Cuff Size: Normal)   Pulse 73   Ht '5\' 5"'$  (1.651 m)   Wt 210 lb (95.3 kg)    SpO2 98%   BMI 34.95 kg/m   General Medical Exam:   General:  Well appearing, comfortable  Eyes/ENT: see cranial nerve examination.   Neck: No masses appreciated.  Full range of motion without tenderness.  No carotid bruits. Respiratory:  Clear to auscultation, good air entry bilaterally.   Cardiac:  Regular rate and rhythm, no murmur.   Ext: no edema  Neurological Exam: MENTAL STATUS including orientation to time, place, person, recent and remote memory, attention span and concentration, language, and fund of knowledge is normal.  Speech is not dysarthric.  CRANIAL NERVES: No visual field defects.  Pupils equal round and reactive to light.  Normal conjugate, extra-ocular eye movements in all directions of gaze.  No ptosis. Normal facial sensation.  Face is symmetric. Palate elevates symmetrically.  Tongue is midline.  MOTOR:  Motor strength is 5/5 in all extremities.  No pronator drift.  Tone is normal.    MSRs:  Reflexes are 2+/4 throughout.  SENSORY:  Intact to vibration throughout  COORDINATION/GAIT:  Normal finger-to- nose-finger.  Intact rapid alternating movements bilaterally.  Gait narrow based and stable.  Stressed and tandem gait intact.    Data: MRI brain wwo contrast 09/05/2013: Multiple foci of white matter T2 signal abnormality in both cerebral hemispheres and in the upper cervical spinal cord. Findings are nonspecific but most concerning for demyelinating disease. No abnormal enhancement to suggest active demyelination.   MRI cervical spine wwo contrast 09/14/2013: 1. Cervical MRI fails to confirm definite abnormal signal or enhancement within the cervical cord.  2. Small disc protrusions at C5-6 and C6-7 without cord deformity or foraminal compromise.  Labs 08/08/2013: WBC 7.7, Hgb 14.2, PLT 220, Na 140, K 4.6, Cr 0.66, AST 25, ALT 43*, TSH 1.6, ESR 3, vitamin B12 553, ANA neg  CSF 02/20/2014:  R0 W3 G107* P26  IgG 1.3, oligoclonal bands absent, ACE 7, cytology  negative  MRI brain and cervical spine wwo contrast 07/29/2014:   1. Question new 4 mm T2 lesion within the right cerebral peduncle.  This could represent progression of a demyelinating process. 2. Subcortical T2 hyperintensities are otherwise stable. 3. Stable left parietal occipital infarct. 4. Minimal spondylosis of the cervical spine without change. There is no significant cord signal abnormality within the cervical spine.  MRI brain and cervical wwo contrast 09/28/2015: 1. 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 No abnormal enhancement to suggest active demyelination. 2. No cervical spinal cord lesions identified. 3. Slightly increased size of C5-6 disc protrusion without stenosis.  MRI thoracic spine wwo contrast 10/19/2016: 1 cm area of cord hyperintensity at T10 without enhancement. This is most consistent with multiple sclerosis. No other cord lesions Moderate disc degeneration at T11-12 without spinal stenosis.  MRI brain and cervical spine 10/31/2017:: 1. Stable mild cerebral white matter disease. No evidence of active demyelination. 2. 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  MRI thoracic spine 02/07/2018: No new abnormality since  the prior MRI. Small focus of T2 hyperintensity within the cord at T10 seen on the prior MRI is barely perceptible today. The lesion does not enhance. No new lesion is identified. The cord is otherwise unremarkable. Mild degenerative disc disease without central canal or foraminal narrowing  IMPRESSION: Relapsing-remitting multiple sclerosis, diagnoised 2015 with long history of various neurological symptoms since 1990s.  She had elected not to be on disease modifying therapies and is being monitored clinically.  Her imaging from this summer shows no evidence of disease activity.  Disease burden involves mild cerebral disease, faint T10 lesion, and right cerebral peduncle lesion. Clinically, she does not have new  neurological complaints and exam is normal.  Continue to follow clinically.  Recommend annual eye exam. Continue vitamin D 4000U daily  Return to clinic in 6 months   Thank you for allowing me to participate in patient's care.  If I can answer any additional questions, I would be pleased to do so.    Sincerely,    Algie Cales K. Posey Pronto, DO

## 2018-04-30 ENCOUNTER — Encounter

## 2018-04-30 ENCOUNTER — Ambulatory Visit: Payer: Medicaid Other | Admitting: Neurology

## 2018-09-11 ENCOUNTER — Encounter: Payer: Self-pay | Admitting: Neurology

## 2018-09-11 ENCOUNTER — Ambulatory Visit: Payer: Medicaid Other | Admitting: Neurology

## 2018-09-24 ENCOUNTER — Ambulatory Visit: Payer: Medicaid Other | Admitting: Neurology

## 2018-12-27 ENCOUNTER — Encounter: Payer: Self-pay | Admitting: Neurology

## 2018-12-27 ENCOUNTER — Telehealth: Payer: Self-pay | Admitting: Neurology

## 2018-12-27 NOTE — Telephone Encounter (Signed)
Pt called back. °

## 2018-12-27 NOTE — Telephone Encounter (Signed)
New Message  Patient returning nurses call

## 2018-12-31 ENCOUNTER — Encounter: Payer: Self-pay | Admitting: Neurology

## 2018-12-31 ENCOUNTER — Ambulatory Visit: Payer: Medicaid Other | Admitting: Neurology

## 2018-12-31 ENCOUNTER — Other Ambulatory Visit: Payer: Self-pay

## 2018-12-31 VITALS — BP 118/64 | HR 87 | Ht 60.0 in | Wt 217.0 lb

## 2018-12-31 DIAGNOSIS — G35 Multiple sclerosis: Secondary | ICD-10-CM

## 2018-12-31 DIAGNOSIS — R202 Paresthesia of skin: Secondary | ICD-10-CM | POA: Diagnosis not present

## 2018-12-31 NOTE — Progress Notes (Signed)
Follow-up Visit   Date: 12/31/18    Stephanie Webb MRN: [id number] DOB: Nov 23, 1965   Interim History: Stephanie Webb is a 53 y.o. right-handed Caucasian female with history of hypertension, GERD, irritable bowel syndrome, and palpitations returning to the clinic for follow-up of multiple sclerosis.     History of present illness: She has history of transient bilateral vision loss, double vision, generalized fatigue, numbness/tingling of the arms > legs, and muscle aches since the late 1990s. All of her symptoms are fluctuating and occur for several days at a time about 3-4 times per year.  In July 2014, she says she had an episode of bilateral leg numbness and was unable to lift her legs. She did not go to the ED and had out-patient MRI brain which showed white matter changes involving the cerebral hemisphere and upper cervical cord, concerning for demyelinating disease.  No active disease.  CSF testing was essentially normal, except elevated CSF glucose.  Particularly, there was no signs of inflammation.  MRI brain performed on 07/29/2014 showed one new right cerebral peduncle lesion without enhancement. Over the years, she has continued to have spells of numbness/tingling of the arms and legs and fatigue.  In 2018, MRI thoracic spine which shows 1 cm area of cord hyperintensity at T10 without enhancement.  No prior imaging to compare with, so it is difficult to determine the age of this lesion.  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  She also has her own tanning salon.    UPDATE 12/31/2018:  She is here with for follow-up visit.  She feels that she is having a few weeks of forgetfulness and also noticed increased numbness/tingling of the fingers, which is worse in the thumb.  It often wakes her up from sleeping.  No hand weakness, falls, or worsening imbalance.    Medications:  Current Outpatient Medications on File Prior to  Visit  Medication Sig Dispense Refill  . busPIRone (BUSPAR) 10 MG tablet Take 10 mg by mouth daily as needed.     . Cholecalciferol (VITAMIN D) 2000 UNITS tablet Take 2,000 Units by mouth daily.    . metFORMIN (GLUCOPHAGE-XR) 500 MG 24 hr tablet TAKE 1 TABLET BY MOUTH ONCE DAILY FOR DIABETES  2  . metoprolol succinate (TOPROL-XL) 50 MG 24 hr tablet Take 50 mg by mouth every evening. Take with or immediately following a meal.    . Multiple Vitamins-Minerals (QC WOMENS DAILY MULTIVITAMIN PO) Take by mouth.    Marland Kitchen omeprazole (PRILOSEC) 40 MG capsule Take 1 capsule (40 mg total) by mouth 2 (two) times daily. 60 capsule 5   No current facility-administered medications on file prior to visit.     Allergies:  Allergies  Allergen Reactions  . Banana Anaphylaxis  . Cantaloupe Extract Allergy Skin Test Anaphylaxis  . Cherry Anaphylaxis  . Citrullus Vulgaris Anaphylaxis  . Red Dye   . Latex Itching  . Sulfa Antibiotics Itching     Review of Systems:  CONSTITUTIONAL: No fevers, chills, night sweats, or weight loss.   EYES: No visual changes or eye pain ENT: No hearing changes.  No history of nose bleeds.   RESPIRATORY: No cough, wheezing and shortness of breath.   CARDIOVASCULAR: Negative for chest pain, and palpitations.   GI: Negative for abdominal discomfort, blood in stools or black stools.  No recent change in bowel habits.   GU:  No history of incontinence.  MUSCLOSKELETAL:+ history of joint pain or swelling.  +myalgias.   SKIN: Negative for lesions, rash, and itching   ENDOCRINE: Negative for cold or heat intolerance, polydipsia or goiter.   PSYCH: No depression or anxiety symptoms.   NEURO: As Above.   Vital Signs:  BP 118/64   Pulse 87   Ht 5' (1.524 m)   Wt 217 lb (98.4 kg)   SpO2 98%   BMI 42.38 kg/m     General Medical Exam:   General:  Well appearing, comfortable  Eyes/ENT: see cranial nerve examination.   Neck:   No carotid bruits. Respiratory:  Clear to  auscultation, good air entry bilaterally.   Cardiac:  Regular rate and rhythm, no murmur.   Ext:  No edema  Neurological Exam: MENTAL STATUS including orientation to time, place, person, recent and remote memory, attention span and concentration, language, and fund of knowledge is normal.  Speech is not dysarthric.  CRANIAL NERVES:  Pupils equal round and reactive to light.  Normal conjugate, extra-ocular eye movements in all directions of gaze.  No ptosis.  Face is symmetric. Palate elevates symmetrically.  Tongue is midline.  MOTOR:  Motor strength is 5/5 in all extremities.  No pronator drift.  Tone is normal.    MSRs:  Reflexes are 2+/4 throughout.  SENSORY:  Intact to vibration throughout.  Mild Tinel's at the right wrist  COORDINATION/GAIT:  Normal finger-to- nose-finger.  Intact rapid alternating movements bilaterally.  Gait narrow based and stable.  Stressed and tandem gait intact.    Data: MRI brain wwo contrast 09/05/2013: Multiple foci of white matter T2 signal abnormality in both cerebral hemispheres and in the upper cervical spinal cord. Findings are nonspecific but most concerning for demyelinating disease. No abnormal enhancement to suggest active demyelination.   MRI cervical spine wwo contrast 09/14/2013: 1. Cervical MRI fails to confirm definite abnormal signal or enhancement within the cervical cord.  2. Small disc protrusions at C5-6 and C6-7 without cord deformity or foraminal compromise.  Labs 08/08/2013: WBC 7.7, Hgb 14.2, PLT 220, Na 140, K 4.6, Cr 0.66, AST 25, ALT 43*, TSH 1.6, ESR 3, vitamin B12 553, ANA neg  CSF 02/20/2014:  R0 W3 G107* P26  IgG 1.3, oligoclonal bands absent, ACE 7, cytology negative  MRI brain and cervical spine wwo contrast 07/29/2014:   1. Question new 4 mm T2 lesion within the right cerebral peduncle.  This could represent progression of a demyelinating process. 2. Subcortical T2 hyperintensities are otherwise stable. 3. Stable left parietal  occipital infarct. 4. Minimal spondylosis of the cervical spine without change. There is no significant cord signal abnormality within the cervical spine.  MRI brain and cervical wwo contrast 09/28/2015: 1. 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 No abnormal enhancement to suggest active demyelination. 2. No cervical spinal cord lesions identified. 3. Slightly increased size of C5-6 disc protrusion without stenosis.  MRI thoracic spine wwo contrast 10/19/2016: 1 cm area of cord hyperintensity at T10 without enhancement. This is most consistent with multiple sclerosis. No other cord lesions Moderate disc degeneration at T11-12 without spinal stenosis.  MRI brain and cervical spine 10/31/2017:: 1. Stable mild cerebral white matter disease. No evidence of active demyelination. 2. 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  MRI thoracic spine 02/07/2018: No new abnormality since the prior MRI. Small focus of T2 hyperintensity within the cord at T10 seen on the prior MRI is barely perceptible today. The lesion does not enhance. No new lesion  is identified. The cord is otherwise unremarkable. Mild degenerative disc disease without central canal or foraminal narrowing  IMPRESSION: 1. Relapsing-remitting multiple sclerosis, diagnoised 2015 with long history of various neurological symptoms since 1990s.  She had elected not to be on disease modifying therapies and is being monitored clinically.  Disease burden involves mild cerebral disease, faint T10 lesion, and right cerebral peduncle lesion, which has been stable.  Cognitive complaints are intermittent and likely related to underlying disease.  I recommend that she engage in mentally stimulating activities and stay physically active.  She is due for annual surveillance imaging with MRI brain, cervical spine, and thoracic spine wwo contrast.  Recommend annual eye exam. Continue vitamin D 4000U daily  2.  Right  hand paresthesias, most suggestive of CTS.   Start using a wrist splint at bedtime Avoid hyperflexion at the wrist.   Return to clinic in 6 months   Thank you for allowing me to participate in patient's care.  If I can answer any additional questions, I would be pleased to do so.    Sincerely,     K. Posey Pronto, DO

## 2018-12-31 NOTE — Patient Instructions (Addendum)
Start using a wrist splint at bedtime for probable carpal tunnel syndrome  MRI brain, cervical spine and thoracic spine wwo contrast  Return to clinic in 6 months

## 2019-01-29 ENCOUNTER — Telehealth: Payer: Self-pay | Admitting: Neurology

## 2019-01-29 ENCOUNTER — Other Ambulatory Visit: Payer: Self-pay

## 2019-01-29 DIAGNOSIS — G35 Multiple sclerosis: Secondary | ICD-10-CM

## 2019-01-29 NOTE — Telephone Encounter (Signed)
Patient is calling in about MRI; wanting to know why it doesn't include her head. She said it was back and neck but not head and she wanted to make sure that was ordered right. Thanks!

## 2019-02-06 ENCOUNTER — Other Ambulatory Visit: Payer: Medicaid Other

## 2019-02-13 ENCOUNTER — Other Ambulatory Visit: Payer: Medicaid Other

## 2019-03-05 ENCOUNTER — Ambulatory Visit
Admission: RE | Admit: 2019-03-05 | Discharge: 2019-03-05 | Disposition: A | Discharge status: Home / Self Care | Payer: Medicaid Other | Source: Ambulatory Visit | Attending: Neurology | Admitting: Neurology

## 2019-03-05 ENCOUNTER — Other Ambulatory Visit: Payer: Self-pay

## 2019-03-05 DIAGNOSIS — G35 Multiple sclerosis: Secondary | ICD-10-CM

## 2019-03-05 MED ORDER — GADOBENATE DIMEGLUMINE 529 MG/ML IV SOLN
20.0000 mL | Freq: Once | INTRAVENOUS | Status: AC | PRN
  Administered 2019-03-05: 20 mL via INTRAVENOUS

## 2019-03-07 ENCOUNTER — Telehealth: Payer: Self-pay

## 2019-03-07 NOTE — Telephone Encounter (Signed)
Patient advised of Mri results

## 2019-03-12 ENCOUNTER — Other Ambulatory Visit: Payer: Medicaid Other

## 2019-03-20 ENCOUNTER — Encounter: Payer: Self-pay | Admitting: Radiology

## 2019-03-20 ENCOUNTER — Ambulatory Visit
Admission: RE | Admit: 2019-03-20 | Discharge: 2019-03-20 | Disposition: A | Discharge status: Home / Self Care | Payer: Medicaid Other | Source: Ambulatory Visit | Attending: Neurology | Admitting: Neurology

## 2019-03-20 DIAGNOSIS — G35 Multiple sclerosis: Secondary | ICD-10-CM

## 2019-03-20 MED ORDER — GADOBENATE DIMEGLUMINE 529 MG/ML IV SOLN
20.0000 mL | Freq: Once | INTRAVENOUS | Status: AC | PRN
  Administered 2019-03-20: 20 mL via INTRAVENOUS

## 2019-06-27 ENCOUNTER — Other Ambulatory Visit: Payer: Self-pay

## 2019-06-27 ENCOUNTER — Telehealth (INDEPENDENT_AMBULATORY_CARE_PROVIDER_SITE_OTHER): Payer: Medicaid Other | Admitting: Neurology

## 2019-06-27 ENCOUNTER — Encounter: Payer: Self-pay | Admitting: Neurology

## 2019-06-27 VITALS — Ht 65.0 in | Wt 210.0 lb

## 2019-06-27 DIAGNOSIS — G35 Multiple sclerosis: Secondary | ICD-10-CM | POA: Diagnosis not present

## 2019-06-27 DIAGNOSIS — R202 Paresthesia of skin: Secondary | ICD-10-CM | POA: Diagnosis not present

## 2019-06-27 NOTE — Progress Notes (Signed)
Virtual Visit via Video Note The purpose of this virtual visit is to provide medical care while limiting exposure to the novel coronavirus.    Consent was obtained for video visit:  Yes.   Answered questions that patient had about telehealth interaction:  Yes.   I discussed the limitations, risks, security and privacy concerns of performing an evaluation and management service by telemedicine. I also discussed with the patient that there may be a patient responsible charge related to this service. The patient expressed understanding and agreed to proceed.  Pt location: Home Physician Location: office Name of referring provider:  Lavonne Chick, MD I connected with Stephanie Webb at patients initiation/request on 06/27/2019 at  8:10 AM EST by video enabled telemedicine application and verified that I am speaking with the correct person using two identifiers. Pt MRN:  [id number] Pt DOB:  [birth year] Video Participants:  Stephanie Webb   History of Present Illness: This is a 54 y.o. female returning for follow-up of relapsing-remitting multiple sclerosis.  She underwent imaging of the neuraxis in September/October 2020 which showed stable white matter disease in the brain and thoracic spine at T10.  No cervical lesions or new brain lesions.  She reports having a stressful situation in December and since this time has been in each spell of fatigue, generalized weakness, low energy.  Her right hand numbness is unchanged.  She tried using a wrist splint for 1.5 weeks but did not appreciate any improvement.  Observations/Objective:   There were no vitals filed for this visit. Patient is awake, alert, and appears comfortable.  Oriented x 4.   Extraocular muscles are intact. No ptosis.  Face is symmetric.  Speech is not dysarthric. Antigravity in all extremities.   Gait appears normal.  She is able to stand on toes.Marland Kitchen  DATA: MRI thoracic and lumbar spine wwo contrast 03/20/2019: 1. Stable  white matter lesion in the thoracic spinal cord at the level of T10 without enhancement consistent with a area of chronic demyelination. No new or enhancing spinal cord lesions. 2. No acute osseous injury of the thoracic spine. 3. No acute osseous injury of the lumbar spine. 4. Mild degenerative disease with disc height loss and a broad-based disc bulge at T11-12. 5. At L4-5 and L5-S1 there is a minimal broad-based disc bulge and mild left facet arthropathy.  MRI cervical spine 03/06/2019: 1. No evidence of cervical spinal cord demyelination. 2. Chronic disc degeneration at C3-C4, C5-C6, and C6-C7. Mild progression since 2019 at the former with borderline spinal stenosis at that level.  MRI brain wwo contrast 03/06/2019: 1. No acute intracranial abnormality and stable MRI appearance of the brain since 2019. Non-specific signal changes in the cerebral white matter plus areas if post ischemic appearing signal abnormality in the posterior left hemisphere and cerebellum. 2. Cervical spine MRI reported separately.  Assessment and Plan:  1.  Relapsing remitting multiple sclerosis, diagnosed 2015 with long history of various neurological symptoms since 1990s.  Disease burden involves mild cerebral disease, T10 lesion, and right cerebral peduncle lesion, which has been stable on imaging from 2020, which was personally reviewed.  Despite multiple discussions on the role of disease modifying therapy to reduce frequency of relapse, disease burden, and functional disability, patient has elected not to be on DMTs.  Therefore, she has been monitored clinically.  Current spell is most likely stress-induced as there are no focal neurological symptoms.  She is up-to-date with annual eye exam She is compliant with vitamin D supplementation  2.  Right hand paresthesias, mild and stable.  Consider electrodiagnostic testing of the right hand if symptoms progress.   Follow Up Instructions:   I discussed the  assessment and treatment plan with the patient. The patient was provided an opportunity to ask questions and all were answered. The patient agreed with the plan and demonstrated an understanding of the instructions.   The patient was advised to call back or seek an in-person evaluation if the symptoms worsen or if the condition fails to improve as anticipated.  Follow-up in 9 months  Total time spent:  25 minutes     Alda Berthold, DO

## 2019-07-05 ENCOUNTER — Telehealth: Payer: Medicaid Other | Admitting: Neurology

## 2020-01-24 ENCOUNTER — Encounter: Payer: Self-pay | Admitting: Gastroenterology

## 2020-02-11 ENCOUNTER — Encounter: Payer: Self-pay | Admitting: *Deleted

## 2020-02-24 ENCOUNTER — Ambulatory Visit: Payer: Medicaid Other | Admitting: Cardiology

## 2020-03-06 ENCOUNTER — Ambulatory Visit: Payer: Medicaid Other | Admitting: Neurology

## 2020-03-16 ENCOUNTER — Encounter: Payer: Self-pay | Admitting: Cardiology

## 2020-03-16 ENCOUNTER — Ambulatory Visit (INDEPENDENT_AMBULATORY_CARE_PROVIDER_SITE_OTHER): Payer: Medicaid Other | Admitting: Cardiology

## 2020-03-16 ENCOUNTER — Other Ambulatory Visit: Payer: Self-pay

## 2020-03-16 ENCOUNTER — Ambulatory Visit (INDEPENDENT_AMBULATORY_CARE_PROVIDER_SITE_OTHER): Payer: Medicaid Other

## 2020-03-16 VITALS — BP 124/80 | HR 69 | Ht 65.0 in | Wt 204.2 lb

## 2020-03-16 DIAGNOSIS — I10 Essential (primary) hypertension: Secondary | ICD-10-CM | POA: Diagnosis not present

## 2020-03-16 DIAGNOSIS — R002 Palpitations: Secondary | ICD-10-CM | POA: Diagnosis not present

## 2020-03-16 DIAGNOSIS — R011 Cardiac murmur, unspecified: Secondary | ICD-10-CM

## 2020-03-16 DIAGNOSIS — E7800 Pure hypercholesterolemia, unspecified: Secondary | ICD-10-CM

## 2020-03-16 NOTE — Progress Notes (Signed)
Cardiology Office Note:    Date:  03/16/2020   ID:  Stephanie Webb, DOB 1965/06/22, MRN [id number]  PCP:  Lavonne Chick, MD (Inactive)  [facility] Cardiologist:  Kate Sable, MD  [place] Electrophysiologist:  None   Referring MD: Laneta Simmers, NP   Chief Complaint  Patient presents with  . New Patient (Initial Visit)    Ref by Laneta Simmers, NP for evaluation of heart murmur and palpitations. Meds reviewed by the pt. verbally. Pt. c/o palpitations/fluttering in chest at times.     History of Present Illness:    Stephanie Webb is a 54 y.o. female with a hx of hypertension, hyperlipidemia, diabetes who presents due to heart murmur and palpitations.  Patient states having palpitations for years now.  Had a 24-hour Holter years ago which was not significant.  Has a family history of heart disease in the grandparents and she is worried about her cardiac condition.  States having occasional palpitations maybe once to twice weekly, although it may be more because she does not pay much attention to it anymore.  Saw her primary care provider recently where cardiac murmur was noted on exam.  She denies chest pain, shortness of breath, edema, dizziness, syncope.  She smokes sparingly in her teens for about 8 to 9 years.  Takes metoprolol to help with palpitations and hypertension.   Past Medical History:  Diagnosis Date  . Anxiety   . Cervical cancer ([place]) 1992  . Diabetes mellitus without complication ([place])   . Diverticulitis   . GERD (gastroesophageal reflux disease)   . Heart murmur   . Hyperlipidemia   . Hypertension    takes Metoprolol for irregular heart beat  . Irregular heart beat    takes Metoprolol for this  . Irritable bowel syndrome   . Pneumonia 2008  . Status post dilation of esophageal narrowing     Past Surgical History:  Procedure Laterality Date  . APPENDECTOMY    . CESAREAN SECTION    . Ovary removal      Current  Medications: Current Meds  Medication Sig  . atorvastatin (LIPITOR) 20 MG tablet SMARTSIG:1 Tablet(s) By Mouth Every Evening  . Cholecalciferol (VITAMIN D) 2000 UNITS tablet Take 2,000 Units by mouth daily.  . metFORMIN (GLUMETZA) 1000 MG (MOD) 24 hr tablet Take 1,000 mg by mouth daily with breakfast.  . metoprolol tartrate (LOPRESSOR) 50 MG tablet Take 50 mg by mouth 2 (two) times daily.  . Multiple Vitamins-Minerals (QC WOMENS DAILY MULTIVITAMIN PO) Take by mouth daily.      Allergies:   Banana, Cantaloupe extract allergy skin test, Cherry, Citrullus vulgaris, Red dye, Latex, and Sulfa antibiotics   Social History   Socioeconomic History  . Marital status: Married    Spouse name: Not on file  . Number of children: 4  . Years of education: 74  . Highest education level: Not on file  Occupational History  . Occupation: homemaker  Tobacco Use  . Smoking status: Former Smoker    Years: 10.00    Types: Cigarettes    Quit date: 06/06/1994    Years since quitting: 25.7  . Smokeless tobacco: Never Used  Vaping Use  . Vaping Use: Never used  Substance and Sexual Activity  . Alcohol use: Yes    Alcohol/week: 0.0 standard drinks    Comment: Occasional   . 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 Sexual activity:  Yes    Birth control/protection: None  Other Topics Concern  . Not on file  Social History Narrative      She lives with husband and two children. She has two grown children living outside of the home.   Highest level of education:  Associates degree   Right handed   Two story home   Social Determinants of Health   Financial Resource Strain:   . Difficulty of Paying Living Expenses: Not on file  Food Insecurity:   . Worried About Charity fundraiser in the Last Year: Not on file  . Ran Out of Food in the Last Year: Not on file  Transportation Needs:   . Lack of Transportation (Medical): Not on file  . Lack  of Transportation (Non-Medical): Not on file  Physical Activity:   . Days of Exercise per Week: Not on file  . Minutes of Exercise per Session: Not on file  Stress:   . Feeling of Stress : Not on file  Social Connections:   . Frequency of Communication with Friends and Family: Not on file  . Frequency of Social Gatherings with Friends and Family: Not on file  . Attends Religious Services: Not on file  . Active Member of Clubs or Organizations: Not on file  . Attends Archivist Meetings: Not on file  . Marital Status: Not on file     Family History: The patient's family history includes Healthy in her brother; Heart attack (age of onset: 51) in her maternal grandfather; Heart disease in her maternal grandfather; Hypertension in her mother; Lung cancer in her father. There is no history of Colon cancer, Colon polyps, Esophageal cancer, Diabetes, Rectal cancer, or Stomach cancer.  ROS:   Please see the history of present illness.     All other systems reviewed and are negative.  EKGs/Labs/Other Studies Reviewed:    The following studies were reviewed today:   EKG:  EKG is  ordered today.  The ekg ordered today demonstrates normal sinus rhythm, sinus arrhythmias.  Recent Labs: No results found for requested labs within last 8760 hours.  Recent Lipid Panel No results found for: CHOL, TRIG, HDL, CHOLHDL, VLDL, LDLCALC, LDLDIRECT   Risk Assessment/Calculations:      Physical Exam:    VS:  BP 124/80 (BP Location: Right Arm, Patient Position: Sitting, Cuff Size: Normal)   Pulse 69   Ht 5\' 5"  (1.651 m)   Wt 204 lb 4 oz (92.6 kg)   SpO2 99%   BMI 33.99 kg/m     Wt Readings from Last 3 Encounters:  03/16/20 204 lb 4 oz (92.6 kg)  06/27/19 210 lb (95.3 kg)  12/31/18 217 lb (98.4 kg)     GEN:  Well nourished, well developed in no acute distress HEENT: Normal NECK: No JVD; No carotid bruits LYMPHATICS: No lymphadenopathy CARDIAC: RRR, faint systolic murmur, rubs,  gallops RESPIRATORY:  Clear to auscultation without rales, wheezing or rhonchi  ABDOMEN: Soft, non-tender, non-distended MUSCULOSKELETAL:  No edema; No deformity  SKIN: Warm and dry NEUROLOGIC:  Alert and oriented x 3 PSYCHIATRIC:  Normal affect   ASSESSMENT:    1. Palpitations   2. Systolic murmur   3. Pure hypercholesterolemia   4. Primary hypertension    PLAN:    In order of problems listed above:  1. Patient with history of palpitations, denies dizziness or syncope.  Place cardiac monitor x2 weeks to evaluate for any significant arrhythmias. 2. Occasional faint systolic murmur noted  on exam.  Get echocardiogram to evaluate any significant valvular dysfunction or structural abnormality. 3. History of hyperlipidemia, continue Lipitor as prescribed. 4. History of hypertension, BP controlled.  Continue beta-blocker as prescribed.  Follow-up after echo and cardiac monitor.  Total encounter time 60 minutes  Greater than 50% was spent in counseling and coordination of care with the patient   Medication Adjustments/Labs and Tests Ordered: Current medicines are reviewed at length with the patient today.  Concerns regarding medicines are outlined above.  Orders Placed This Encounter  Procedures  . LONG TERM MONITOR (3-14 DAYS)  . EKG 12-Lead  . ECHOCARDIOGRAM COMPLETE   No orders of the defined types were placed in this encounter.   Patient Instructions  Medication Instructions:   Your physician recommends that you continue on your current medications as directed. Please refer to the Current Medication list given to you today.  *If you need a refill on your cardiac medications before your next appointment, please call your pharmacy*   Lab Work: None Ordered If you have labs (blood work) drawn today and your tests are completely normal, you will receive your results only by: Marland Kitchen MyChart Message (if you have MyChart) OR . A paper copy in the mail If you have any lab test  that is abnormal or we need to change your treatment, we will call you to review the results.   Testing/Procedures:  1.  Your physician has requested that you have an echocardiogram. Echocardiography is a painless test that uses sound waves to create images of your heart. It provides your doctor with information about the size and shape of your heart and how well your heart's chambers and valves are working. This procedure takes approximately one hour. There are no restrictions for this procedure.  2.  Your physician has recommended that you wear a Zio monitor. This monitor is a medical device that records the heart's electrical activity. Doctors most often use these monitors to diagnose arrhythmias. Arrhythmias are problems with the speed or rhythm of the heartbeat. The monitor is a small device applied to your chest. You can wear one while you do your normal daily activities. While wearing this monitor if you have any symptoms to push the button and record what you felt. Once you have worn this monitor for the period of time provider prescribed (Usually 14 days), you will return the monitor device in the postage paid box. Once it is returned they will download the data collected and provide [place] with a report which the provider will then review and we will call you with those results. Important tips:  1. Avoid showering during the first 24 hours of wearing the monitor. 2. Avoid excessive sweating to help maximize wear time. 3. Do not submerge the device, no hot tubs, and no swimming pools. 4. Keep any lotions or oils away from the patch. 5. After 24 hours you may shower with the patch on. Take brief showers with your back facing the shower head.  6. Do not remove patch once it has been placed because that will interrupt data and decrease adhesive wear time. 7. Push the button when you have any symptoms and write down what you were feeling. 8. Once you have completed wearing your monitor, remove and  place into box which has postage paid and place in your outgoing mailbox.  9. If for some reason you have misplaced your box then call our office and we can provide another box and/or mail it off for  you.           Follow-Up: At [facility], you and your health needs are our priority.  As part of our continuing mission to provide you with exceptional heart care, we have created designated Provider Care Teams.  These Care Teams include your primary Cardiologist (physician) and Advanced Practice Providers (APPs -  Physician Assistants and Nurse Practitioners) who all work together to provide you with the care you need, when you need it.  We recommend signing up for the patient portal called "MyChart".  Sign up information is provided on this After Visit Summary.  MyChart is used to connect with patients for Virtual Visits (Telemedicine).  Patients are able to view lab/test results, encounter notes, upcoming appointments, etc.  Non-urgent messages can be sent to your provider as well.   To learn more about what you can do with MyChart, go to [email or web address].    Your next appointment:   5 week(s)  The format for your next appointment:   In Person  Provider:   Kate Sable, MD   Other Instructions      Signed, Kate Sable, MD  03/16/2020 12:29 PM    [place]

## 2020-03-16 NOTE — Patient Instructions (Signed)
Medication Instructions:   Your physician recommends that you continue on your current medications as directed. Please refer to the Current Medication list given to you today.  *If you need a refill on your cardiac medications before your next appointment, please call your pharmacy*   Lab Work: None Ordered  If you have labs (blood work) drawn today and your tests are completely normal, you will receive your results only by: . MyChart Message (if you have MyChart) OR . A paper copy in the mail If you have any lab test that is abnormal or we need to change your treatment, we will call you to review the results.   Testing/Procedures:  1.  Your physician has requested that you have an echocardiogram. Echocardiography is a painless test that uses sound waves to create images of your heart. It provides your doctor with information about the size and shape of your heart and how well your heart's chambers and valves are working. This procedure takes approximately one hour. There are no restrictions for this procedure.  2.  Your physician has recommended that you wear a Zio monitor. This monitor is a medical device that records the heart's electrical activity. Doctors most often use these monitors to diagnose arrhythmias. Arrhythmias are problems with the speed or rhythm of the heartbeat. The monitor is a small device applied to your chest. You can wear one while you do your normal daily activities. While wearing this monitor if you have any symptoms to push the button and record what you felt. Once you have worn this monitor for the period of time provider prescribed (Usually 14 days), you will return the monitor device in the postage paid box. Once it is returned they will download the data collected and provide us with a report which the provider will then review and we will call you with those results. Important tips:  1. Avoid showering during the first 24 hours of wearing the monitor. 2. Avoid  excessive sweating to help maximize wear time. 3. Do not submerge the device, no hot tubs, and no swimming pools. 4. Keep any lotions or oils away from the patch. 5. After 24 hours you may shower with the patch on. Take brief showers with your back facing the shower head.  6. Do not remove patch once it has been placed because that will interrupt data and decrease adhesive wear time. 7. Push the button when you have any symptoms and write down what you were feeling. 8. Once you have completed wearing your monitor, remove and place into box which has postage paid and place in your outgoing mailbox.  9. If for some reason you have misplaced your box then call our office and we can provide another box and/or mail it off for you.         Follow-Up: At [facility], you and your health needs are our priority.  As part of our continuing mission to provide you with exceptional heart care, we have created designated Provider Care Teams.  These Care Teams include your primary Cardiologist (physician) and Advanced Practice Providers (APPs -  Physician Assistants and Nurse Practitioners) who all work together to provide you with the care you need, when you need it.  We recommend signing up for the patient portal called "MyChart".  Sign up information is provided on this After Visit Summary.  MyChart is used to connect with patients for Virtual Visits (Telemedicine).  Patients are able to view lab/test results, encounter notes, upcoming appointments,   etc.  Non-urgent messages can be sent to your provider as well.   To learn more about what you can do with MyChart, go to [email or web address].    Your next appointment:   5 weeks   The format for your next appointment:   In Person  Provider:   Brian Agbor-Etang, MD   Other Instructions   

## 2020-03-27 ENCOUNTER — Ambulatory Visit: Payer: Medicaid Other | Admitting: Gastroenterology

## 2020-04-02 ENCOUNTER — Telehealth: Payer: Self-pay | Admitting: Cardiology

## 2020-04-02 ENCOUNTER — Other Ambulatory Visit: Payer: Medicaid Other

## 2020-04-02 NOTE — Telephone Encounter (Signed)
Patient came to office for ECHOCARDIOGRAM - showed up 15 minutes late Patient upset we needed to reschedule - attempted to offer 2p ECHO this afternoon, patient declined  Patient then said who can see me for my chest pain - I offered her a 10a appointment with Vella Raring - patient declined  Attempted to get more information - patient declined  Patient stated she would just go to ER  Please call to discuss

## 2020-04-10 ENCOUNTER — Telehealth: Payer: Self-pay | Admitting: *Deleted

## 2020-04-10 NOTE — Telephone Encounter (Signed)
-----   Message from Kate Sable, MD sent at 04/10/2020  1:13 PM EDT ----- No significant arrhythmias noted on monitor.  Overall benign cardiac monitor, patient triggered events were associated with rare ventricular ectopy.

## 2020-04-10 NOTE — Telephone Encounter (Signed)
No answer. Left message to call back.   

## 2020-04-14 NOTE — Telephone Encounter (Signed)
LMOM to call back for Zio results.

## 2020-04-15 NOTE — Telephone Encounter (Signed)
Patient returning call for results 

## 2020-04-15 NOTE — Telephone Encounter (Signed)
The patient has been notified of the result and verbalized understanding.  All questions (if any) were answered. [place], RN 04/15/2020 11:07 AM

## 2020-04-16 ENCOUNTER — Ambulatory Visit (INDEPENDENT_AMBULATORY_CARE_PROVIDER_SITE_OTHER): Payer: Medicaid Other

## 2020-04-16 ENCOUNTER — Other Ambulatory Visit: Payer: Self-pay

## 2020-04-16 DIAGNOSIS — R011 Cardiac murmur, unspecified: Secondary | ICD-10-CM

## 2020-04-16 LAB — ECHOCARDIOGRAM COMPLETE
AR max vel: 2 cm2
AV Area VTI: 2.17 cm2
AV Area mean vel: 2.06 cm2
AV Mean grad: 4 mmHg
AV Peak grad: 7.3 mmHg
Ao pk vel: 1.35 m/s
Area-P 1/2: 3.33 cm2
Calc EF: 58 %
S' Lateral: 4.1 cm
Single Plane A2C EF: 54.3 %
Single Plane A4C EF: 62.3 %

## 2020-04-20 ENCOUNTER — Ambulatory Visit (INDEPENDENT_AMBULATORY_CARE_PROVIDER_SITE_OTHER): Payer: Medicaid Other | Admitting: Cardiology

## 2020-04-20 ENCOUNTER — Encounter: Payer: Self-pay | Admitting: Cardiology

## 2020-04-20 ENCOUNTER — Other Ambulatory Visit: Payer: Self-pay

## 2020-04-20 VITALS — BP 128/90 | HR 69 | Ht 65.0 in | Wt 201.4 lb

## 2020-04-20 DIAGNOSIS — E7800 Pure hypercholesterolemia, unspecified: Secondary | ICD-10-CM | POA: Diagnosis not present

## 2020-04-20 DIAGNOSIS — R011 Cardiac murmur, unspecified: Secondary | ICD-10-CM

## 2020-04-20 DIAGNOSIS — I10 Essential (primary) hypertension: Secondary | ICD-10-CM

## 2020-04-20 DIAGNOSIS — R002 Palpitations: Secondary | ICD-10-CM | POA: Diagnosis not present

## 2020-04-20 NOTE — Progress Notes (Signed)
Cardiology Office Note:    Date:  04/20/2020   ID:  Sherlynn Carbon, DOB [birth year], MRN [id number]  PCP:  Lavonne Chick, MD (Inactive)  [facility] Cardiologist:  Kate Sable, MD  [place] Electrophysiologist:  None   Referring MD: No ref. provider found   Chief Complaint  Patient presents with  . OTHER    Fu echo no complaints today. Meds reviewed verbally with pt.    History of Present Illness:    Stephanie Webb is a 54 y.o. female with a hx of hypertension, hyperlipidemia, diabetes who presents for follow-up.  Last seen due to heart murmur and palpitations.  Cardiac monitor and echocardiogram was ordered.  She presents today, denies any new concerns.  Doing okay.  Denies chest pain, shortness of breath, dizziness or syncope.  Has occasional palpitations but not severe to limit her activities of daily living.   Past Medical History:  Diagnosis Date  . Anxiety   . Cervical cancer ([place]) 1992  . Diabetes mellitus without complication ([place])   . Diverticulitis   . GERD (gastroesophageal reflux disease)   . Heart murmur   . Hyperlipidemia   . Hypertension    takes Metoprolol for irregular heart beat  . Irregular heart beat    takes Metoprolol for this  . Irritable bowel syndrome   . Pneumonia 2008  . Status post dilation of esophageal narrowing     Past Surgical History:  Procedure Laterality Date  . APPENDECTOMY    . CESAREAN SECTION    . Ovary removal      Current Medications: Current Meds  Medication Sig  . atorvastatin (LIPITOR) 20 MG tablet SMARTSIG:1 Tablet(s) By Mouth Every Evening  . Cholecalciferol (VITAMIN D) 2000 UNITS tablet Take 2,000 Units by mouth daily.  . metFORMIN (GLUMETZA) 1000 MG (MOD) 24 hr tablet Take 1,000 mg by mouth daily with breakfast.  . metoprolol tartrate (LOPRESSOR) 50 MG tablet Take 50 mg by mouth 2 (two) times daily.  . Multiple Vitamins-Minerals (QC WOMENS DAILY MULTIVITAMIN PO) Take by mouth daily.       Allergies:   Banana, Cantaloupe extract allergy skin test, Cherry, Citrullus vulgaris, Red dye, Latex, and Sulfa antibiotics   Social History   Socioeconomic History  . Marital status: Married    Spouse name: Not on file  . Number of children: 4  . Years of education: 38  . Highest education level: Not on file  Occupational History  . Occupation: homemaker  Tobacco Use  . Smoking status: Former Smoker    Years: 10.00    Types: Cigarettes    Quit date: 06/06/1994    Years since quitting: 25.8  . Smokeless tobacco: Never Used  Vaping Use  . Vaping Use: Never used  Substance and Sexual Activity  . Alcohol use: Yes    Alcohol/week: 0.0 standard drinks    Comment: Occasional   . 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 Sexual activity: Yes    Birth control/protection: None  Other Topics Concern  . Not on file  Social History Narrative      She lives with husband and two children. She has two grown children living outside of the home.   Highest level of education:  Associates degree   Right handed   Two story home   Social Determinants of Health   Financial Resource Strain:   . Difficulty of Paying Living Expenses: Not on  file  Food Insecurity:   . Worried About Charity fundraiser in the Last Year: Not on file  . Ran Out of Food in the Last Year: Not on file  Transportation Needs:   . Lack of Transportation (Medical): Not on file  . Lack of Transportation (Non-Medical): Not on file  Physical Activity:   . Days of Exercise per Week: Not on file  . Minutes of Exercise per Session: Not on file  Stress:   . Feeling of Stress : Not on file  Social Connections:   . Frequency of Communication with Friends and Family: Not on file  . Frequency of Social Gatherings with Friends and Family: Not on file  . Attends Religious Services: Not on file  . Active Member of Clubs or Organizations: Not on file  . Attends Theatre manager Meetings: Not on file  . Marital Status: Not on file     Family History: The patient's family history includes Healthy in her brother; Heart attack (age of onset: 43) in her maternal grandfather; Heart disease in her maternal grandfather; Hypertension in her mother; Lung cancer in her father. There is no history of Colon cancer, Colon polyps, Esophageal cancer, Diabetes, Rectal cancer, or Stomach cancer.  ROS:   Please see the history of present illness.     All other systems reviewed and are negative.  EKGs/Labs/Other Studies Reviewed:    The following studies were reviewed today:   EKG:  EKG is  ordered today.  The ekg ordered today demonstrates normal sinus rhythm, sinus arrhythmias.  Recent Labs: No results found for requested labs within last 8760 hours.  Recent Lipid Panel No results found for: CHOL, TRIG, HDL, CHOLHDL, VLDL, LDLCALC, LDLDIRECT   Risk Assessment/Calculations:      Physical Exam:    VS:  BP 128/90 (BP Location: Left Arm, Patient Position: Sitting, Cuff Size: Normal)   Pulse 69   Ht 5\' 5"  (1.651 m)   Wt 201 lb 6 oz (91.3 kg)   SpO2 98%   BMI 33.51 kg/m     Wt Readings from Last 3 Encounters:  04/20/20 201 lb 6 oz (91.3 kg)  03/16/20 204 lb 4 oz (92.6 kg)  06/27/19 210 lb (95.3 kg)     GEN:  Well nourished, well developed in no acute distress HEENT: Normal NECK: No JVD; No carotid bruits LYMPHATICS: No lymphadenopathy CARDIAC: RRR, faint systolic murmur, rubs, gallops RESPIRATORY:  Clear to auscultation without rales, wheezing or rhonchi  ABDOMEN: Soft, non-tender, non-distended MUSCULOSKELETAL:  No edema; No deformity  SKIN: Warm and dry NEUROLOGIC:  Alert and oriented x 3 PSYCHIATRIC:  Normal affect   ASSESSMENT:    1. Palpitations   2. Systolic murmur   3. Pure hypercholesterolemia   4. Primary hypertension    PLAN:    In order of problems listed above:  1. Patient with history of palpitations, cardiac monitor with  no significant arrhythmias.  Patient triggered events associated with rare ventricular ectopy.  Patient made aware of results and reassured. 2. Occasional faint systolic murmur noted on exam.  Echo showed normal systolic and diastolic function, no valvular abnormalities noted. 3. hyperlipidemia, continue Lipitor 4. hypertension, BP controlled.  Continue metoprolol.  Follow-up as needed  Total encounter time 35 minutes  Greater than 50% was spent in counseling and coordination of care with the patient   Medication Adjustments/Labs and Tests Ordered: Current medicines are reviewed at length with the patient today.  Concerns regarding medicines are  outlined above.  Orders Placed This Encounter  Procedures  . EKG 12-Lead   No orders of the defined types were placed in this encounter.   Patient Instructions  Medication Instructions:  Your physician recommends that you continue on your current medications as directed. Please refer to the Current Medication list given to you today. *If you need a refill on your cardiac medications before your next appointment, please call your pharmacy*   Lab Work: None Ordered If you have labs (blood work) drawn today and your tests are completely normal, you will receive your results only by: Marland Kitchen MyChart Message (if you have MyChart) OR . A paper copy in the mail If you have any lab test that is abnormal or we need to change your treatment, we will call you to review the results.   Testing/Procedures: None Ordered   Follow-Up: At [facility], you and your health needs are our priority.  As part of our continuing mission to provide you with exceptional heart care, we have created designated Provider Care Teams.  These Care Teams include your primary Cardiologist (physician) and Advanced Practice Providers (APPs -  Physician Assistants and Nurse Practitioners) who all work together to provide you with the care you need, when you need it.  We  recommend signing up for the patient portal called "MyChart".  Sign up information is provided on this After Visit Summary.  MyChart is used to connect with patients for Virtual Visits (Telemedicine).  Patients are able to view lab/test results, encounter notes, upcoming appointments, etc.  Non-urgent messages can be sent to your provider as well.   To learn more about what you can do with MyChart, go to [email or web address].    Your next appointment:   Follow up as needed   The format for your next appointment:   In Person  Provider:   Kate Sable, MD   Other Instructions      Signed, Kate Sable, MD  04/20/2020 12:35 PM    [place]

## 2020-04-20 NOTE — Patient Instructions (Signed)

## 2020-05-19 ENCOUNTER — Ambulatory Visit: Payer: Medicaid Other | Admitting: Gastroenterology

## 2020-06-17 IMAGING — MR MR LUMBAR SPINE WO/W CM
5 of 7 series · 26 of 48 positions shown · IV contrast (multihance)
Comparison: 11/07/2017, 10/19/2016

CLINICAL DATA: Multiple sclerosis diagnosed 9 years ago. No known
injury.

EXAM:
MRI THORACIC AND LUMBAR SPINE WITHOUT AND WITH CONTRAST
TECHNIQUE: Multiplanar and multiecho pulse sequences of the thoracic and lumbar
spine were obtained without and with intravenous contrast.
CONTRAST:  20mL MULTIHANCE GADOBENATE DIMEGLUMINE 529 MG/ML IV SOLN

[Series 3: T2 post-contrast · sagittal · 4.0mm · 0.55mm/px · 3 of 13 slices shown]
[im 1/13]
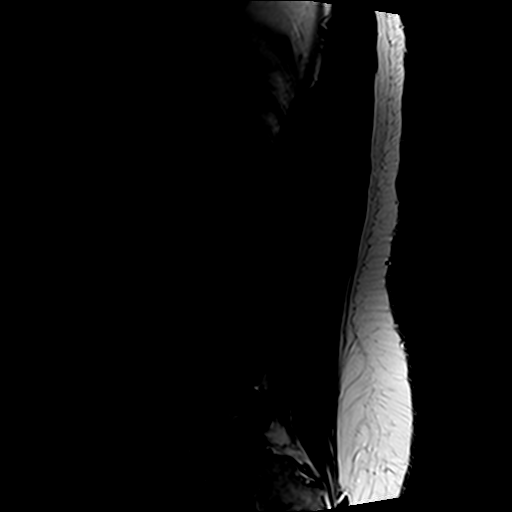
[im 7/13]
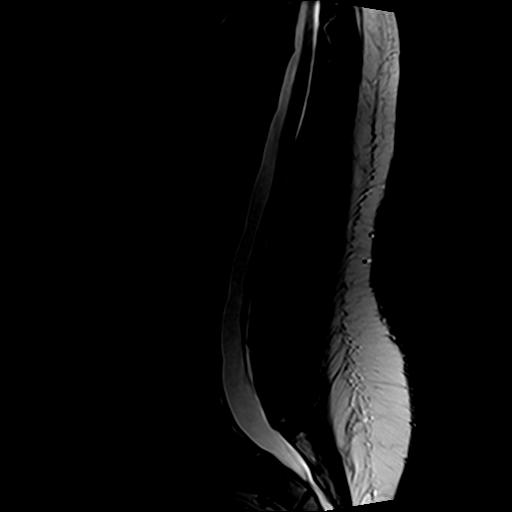
[im 13/13]
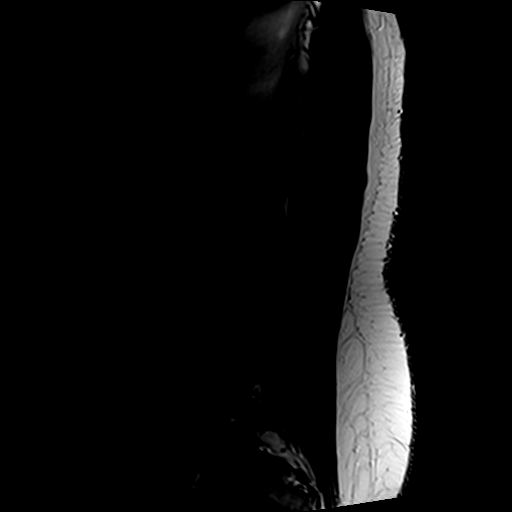

[Series 5: T1 · sagittal · 4.0mm · 0.55mm/px · 3 of 13 slices shown (1 of 2)]
[im 1/13]
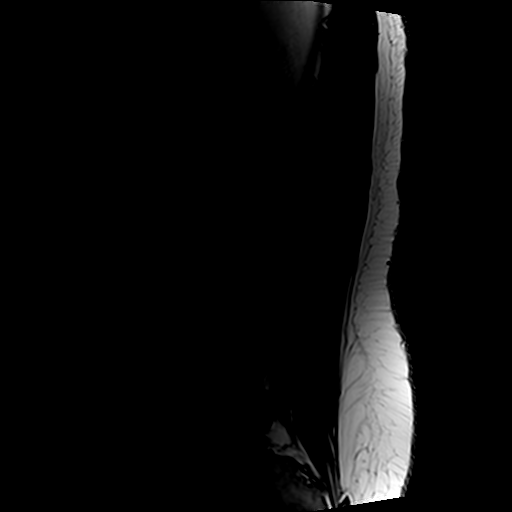
[im 7/13]
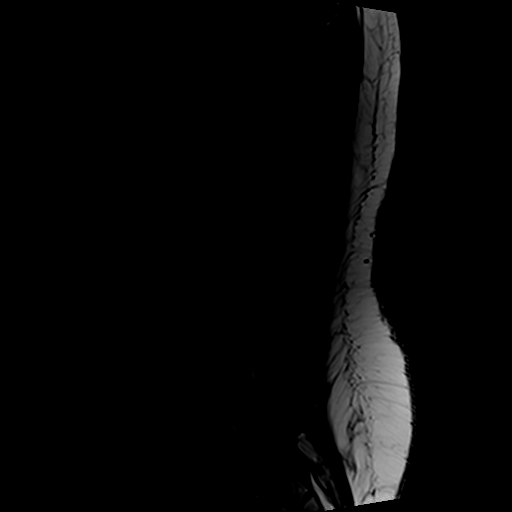
[im 13/13]
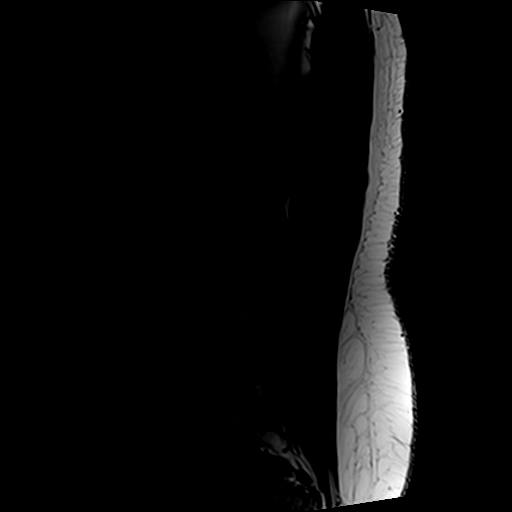

[Series 6: T1 · axial · 4.0mm · 0.35mm/px · z∈[-151,+119]mm · 9 of 49 slices shown (2 of 2)]
[im 1/49]
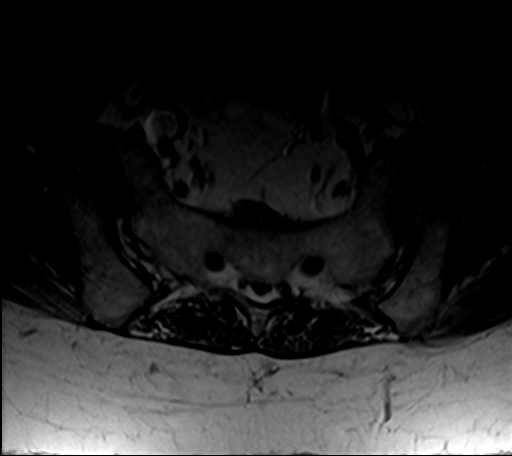
[im 9/49]
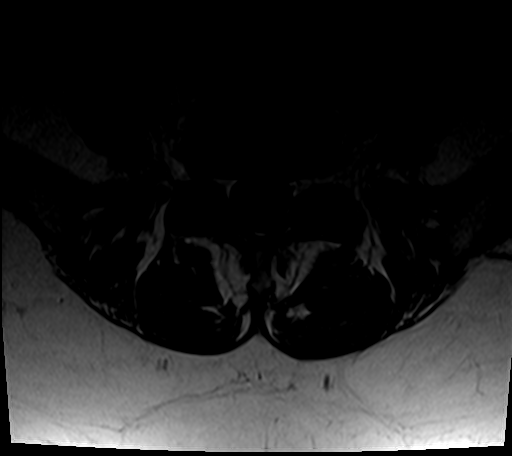
[im 14/49]
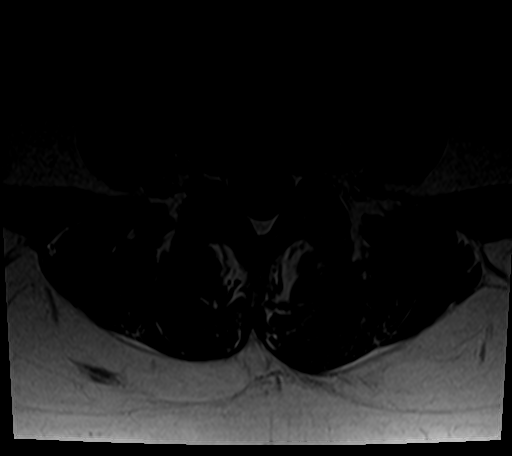
[im 22/49]
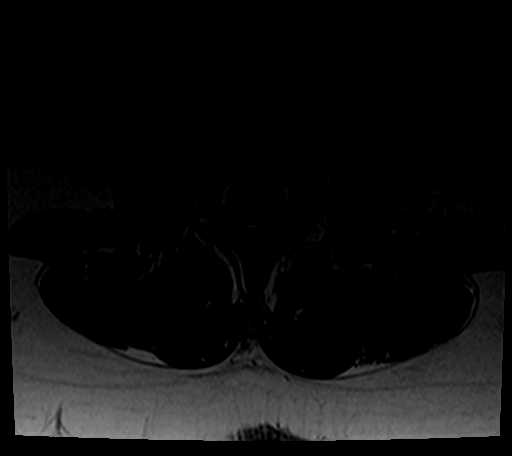
[im 27/49]
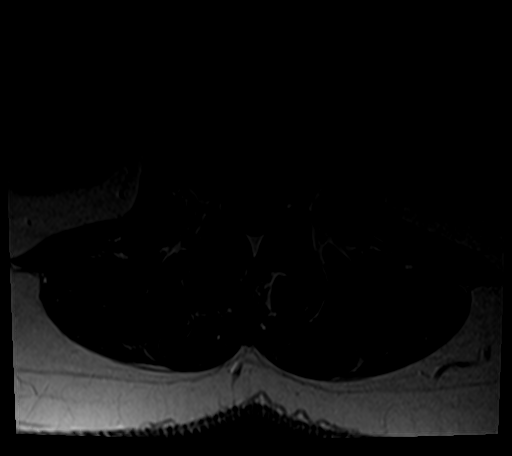
[im 35/49]
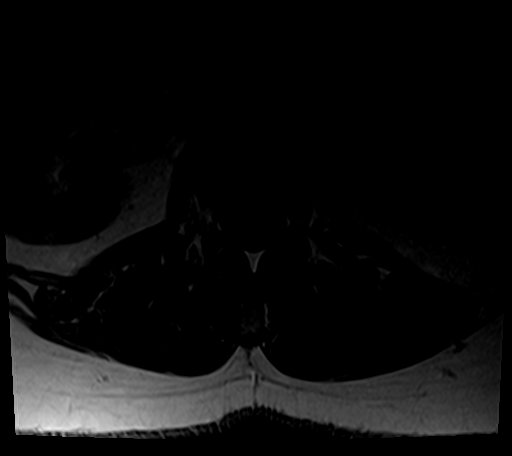
[im 40/49]
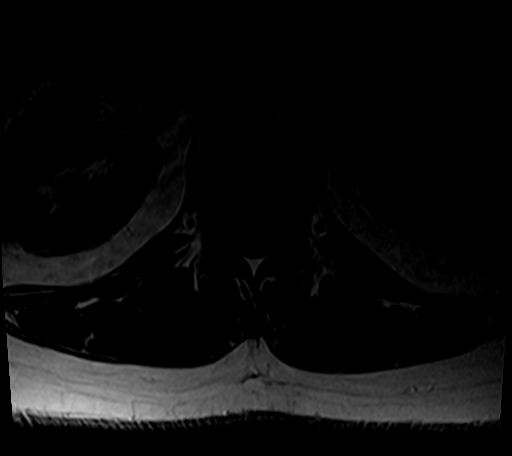
[im 44/49]
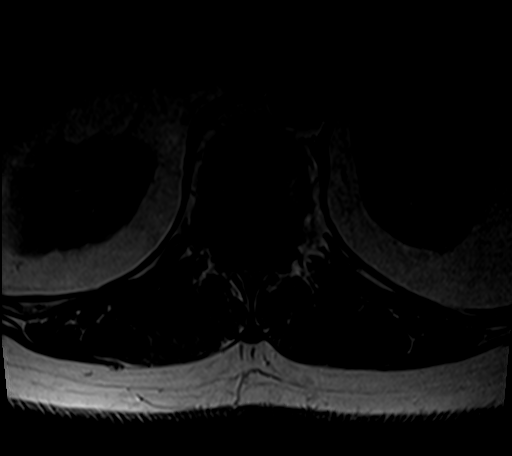
[im 49/49]
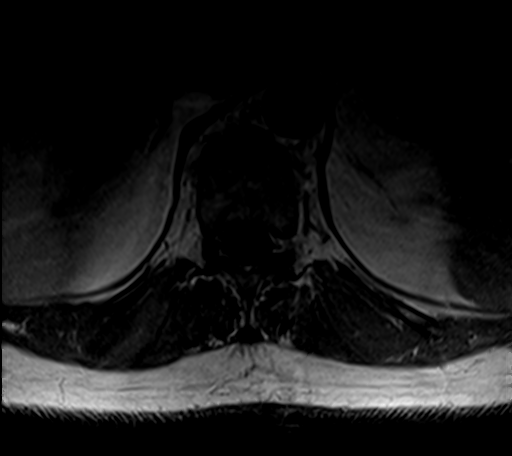

[Series 7: T2 · axial · 4.0mm · 0.70mm/px · z∈[-151,+119]mm · 9 of 49 slices shown]
[im 1/49]
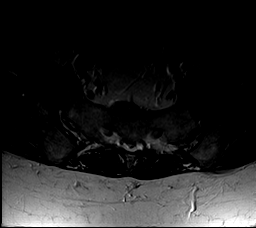
[im 9/49]
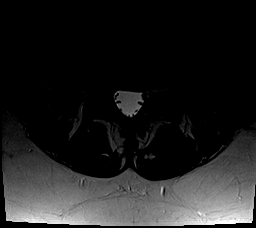
[im 14/49]
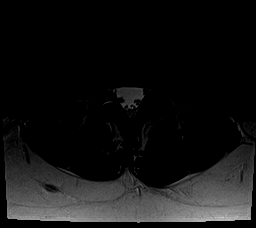
[im 22/49]
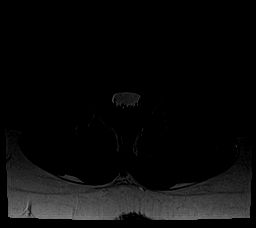
[im 27/49]
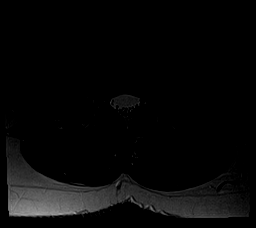
[im 35/49]
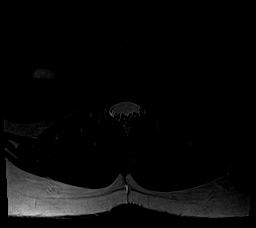
[im 40/49]
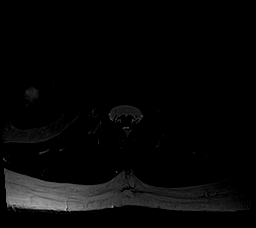
[im 44/49]
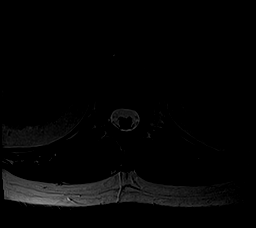
[im 49/49]
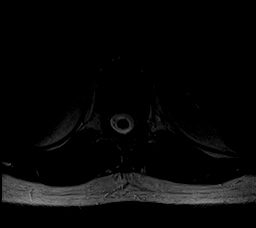

[Series 8: T1 fat-sat post-contrast · sagittal · 4.0mm · 0.88mm/px · 2 of 13 slices shown]
[im 1/13]
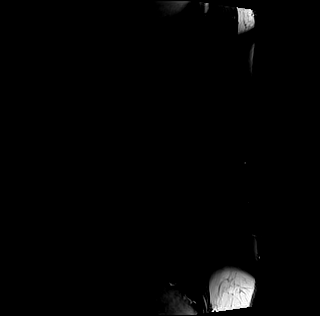
[im 7/13]
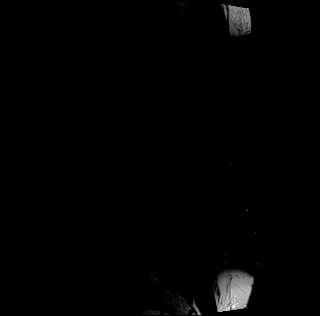

[26 of 48 positions shown; findings below may reference images not displayed]

FINDINGS: MRI THORACIC SPINE FINDINGS

Alignment:  Physiologic.

Vertebrae: No fracture, evidence of discitis, or bone lesion.

Cord: Normal cord morphology. No atrophy of the spinal cord. No
spinal cord mass. Small faint area of T2 hyperintensity in the
thoracic spinal cord at the level of T10 without enhancement
unchanged compared with the prior examination of 11/07/2017. No
other spinal cord lesions are identified.

Paraspinal and other soft tissues: Negative.

Disc levels:

Disc spaces: Mild degenerative disc disease with disc height loss at
T11-12 with endplate sclerosis.

T1-T2: No disc protrusion, foraminal stenosis or central canal
stenosis.

T2-T3: No disc protrusion, foraminal stenosis or central canal
stenosis.

T3-T4: No disc protrusion, foraminal stenosis or central canal
stenosis.

T4-T5: No disc protrusion, foraminal stenosis or central canal
stenosis.

T5-T6: No disc protrusion, foraminal stenosis or central canal
stenosis.

T6-T7: No disc protrusion, foraminal stenosis or central canal
stenosis.

T7-T8: No disc protrusion, foraminal stenosis or central canal
stenosis.

T8-T9: No disc protrusion, foraminal stenosis or central canal
stenosis.

T9-T10: No disc protrusion, foraminal stenosis or central canal
stenosis.

T10-T11: No disc protrusion, foraminal stenosis or central canal
stenosis.

T11-T12: Minimal broad-based disc bulge. No foraminal or central
canal stenosis.

MRI LUMBAR SPINE FINDINGS

Segmentation:  Standard.

Alignment:  Physiologic.

Vertebrae:  No fracture, evidence of discitis, or bone lesion.

Conus medullaris: Extends to the L1 level and appears normal.

Paraspinal and other soft tissues: No acute paraspinal abnormality.

Disc levels:

Disc spaces: Disc desiccation at L4-5 and L5-S1.

T12-L1: No significant disc bulge. No evidence of neural foraminal
stenosis. No central canal stenosis.

L1-L2: No significant disc bulge. No evidence of neural foraminal
stenosis. No central canal stenosis.

L2-L3: No significant disc bulge. No evidence of neural foraminal
stenosis. No central canal stenosis.

L3-L4: No significant disc bulge. No evidence of neural foraminal
stenosis. No central canal stenosis.

L4-L5: Minimal broad-based disc bulge. Mild left facet arthropathy.
No evidence of neural foraminal stenosis. No central canal stenosis.

L5-S1: Minimal broad-based disc bulge. Mild left facet arthropathy.
Mild bilateral foraminal narrowing. No central canal stenosis.
IMPRESSION: 1. Stable white matter lesion in the thoracic spinal cord at the
level of T10 without enhancement consistent with a area of chronic
demyelination. No new or enhancing spinal cord lesions.
2. No acute osseous injury of the thoracic spine.
3. No acute osseous injury of the lumbar spine.
4. Mild degenerative disease with disc height loss and a broad-based
disc bulge at T11-12.
5. At L4-5 and L5-S1 there is a minimal broad-based disc bulge and
mild left facet arthropathy.

## 2020-06-30 ENCOUNTER — Ambulatory Visit: Payer: Medicaid Other | Admitting: Gastroenterology

## 2020-12-14 ENCOUNTER — Other Ambulatory Visit: Payer: Self-pay

## 2020-12-14 ENCOUNTER — Ambulatory Visit: Payer: Medicaid Other | Admitting: Neurology

## 2020-12-14 ENCOUNTER — Encounter: Payer: Self-pay | Admitting: Neurology

## 2020-12-14 VITALS — BP 109/73 | HR 72 | Ht 65.5 in | Wt 194.0 lb

## 2020-12-14 DIAGNOSIS — G35 Multiple sclerosis: Secondary | ICD-10-CM | POA: Diagnosis not present

## 2020-12-14 NOTE — Progress Notes (Signed)
Follow-up Visit   Date: 12/14/20    Stephanie Webb MRN: [id number] DOB: 17-Jul-1965   Interim History: Stephanie Webb is a 55 y.o. right-handed Caucasian female with history of hypertension, GERD, irritable bowel syndrome, and palpitations returning to the clinic for follow-up of multiple sclerosis.     History of present illness: She has history of transient bilateral vision loss, double vision, generalized fatigue, numbness/tingling of the arms > legs, and muscle aches since the late 1990s. All of her symptoms are fluctuating and occur for several days at a time about 3-4 times per year.  In July 2014, she says she had an episode of bilateral leg numbness and was unable to lift her legs. She did not go to the ED and had out-patient MRI brain which showed white matter changes involving the cerebral hemisphere and upper cervical cord, concerning for demyelinating disease.  No active disease.  CSF testing was essentially normal, except elevated CSF glucose.  Particularly, there was no signs of inflammation.  MRI brain performed on 07/29/2014 showed one new right cerebral peduncle lesion without enhancement. Over the years, she has continued to have spells of numbness/tingling of the arms and legs and fatigue.  In 2018, MRI thoracic spine which shows 1 cm area of cord hyperintensity at T10 without enhancement.  No prior imaging to compare with, so it is difficult to determine the age of this lesion.  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  UPDATE 12/14/2020:  She is here for follow-up visit.  She continues to have intermittent numbness/tingling of the arms and legs.  Sometimes she has generalized fatigue and imbalance. She has not suffered any falls.  No focal weakness.  She feels that her legs get more numb and would like to have imaging to monitor her MS.  Her last imaging was in 2020.   Medications:  Current Outpatient Medications on File Prior to Visit  Medication Sig  Dispense Refill   Cholecalciferol (VITAMIN D) 2000 UNITS tablet Take 2,000 Units by mouth daily.     Collagen-Vitamin C-Biotin (COLLAGEN 1500/C PO) Take by mouth.     magnesium 30 MG tablet Take 30 mg by mouth 2 (two) times daily.     metFORMIN (GLUMETZA) 1000 MG (MOD) 24 hr tablet Take 1,000 mg by mouth daily with breakfast.     metoprolol succinate (TOPROL-XL) 50 MG 24 hr tablet Take 50 mg by mouth daily.     Multiple Vitamins-Minerals (QC WOMENS DAILY MULTIVITAMIN PO) Take by mouth daily.      Zinc Sulfate (ZINC 15 PO) Take by mouth.     atorvastatin (LIPITOR) 20 MG tablet SMARTSIG:1 Tablet(s) By Mouth Every Evening (Patient not taking: Reported on 12/14/2020)     No current facility-administered medications on file prior to visit.    Allergies:  Allergies  Allergen Reactions   Banana Anaphylaxis   Cantaloupe Extract Allergy Skin Test Anaphylaxis   Cherry Anaphylaxis   Citrullus Vulgaris Anaphylaxis   Red Dye    Latex Itching   Sulfa Antibiotics Itching     Vital Signs:  BP 109/73   Pulse 72   Ht 5' 5.5" (1.664 m)   Wt 194 lb (88 kg)   SpO2 98%   BMI 31.79 kg/m     Neurological Exam: MENTAL STATUS including orientation to time, place, person, recent and remote memory, attention span and concentration, language, and fund of knowledge is normal.  Speech is not dysarthric.  CRANIAL NERVES:  Pupils  equal round and reactive to light.  Normal conjugate, extra-ocular eye movements in all directions of gaze.  No ptosis.  Face is symmetric. Palate elevates symmetrically.  Tongue is midline.  MOTOR:  Motor strength is 5/5 in all extremities.  No pronator drift.  Tone is normal.    MSRs:  Reflexes are 2+/4 throughout.  SENSORY:  Intact to vibration throughout.    COORDINATION/GAIT:  Normal finger-to- nose-finger.  Intact rapid alternating movements bilaterally.  Gait narrow based and stable.  Stressed and tandem gait intact.    Data: MRI brain wwo contrast  09/05/2013: Multiple foci of white matter T2 signal abnormality in both cerebral hemispheres and in the upper cervical spinal cord. Findings are nonspecific but most concerning for demyelinating disease. No abnormal enhancement to suggest active demyelination.   MRI cervical spine wwo contrast 09/14/2013: 1. Cervical MRI fails to confirm definite abnormal signal or enhancement within the cervical cord.  2. Small disc protrusions at C5-6 and C6-7 without cord deformity or foraminal compromise.  Labs 08/08/2013: WBC 7.7, Hgb 14.2, PLT 220, Na 140, K 4.6, Cr 0.66, AST 25, ALT 43*, TSH 1.6, ESR 3, vitamin B12 553, ANA neg  CSF 02/20/2014:  R0 W3 G107* P26  IgG 1.3, oligoclonal bands absent, ACE 7, cytology negative  MRI brain and cervical spine wwo contrast 07/29/2014:   1. Question new 4 mm T2 lesion within the right cerebral peduncle.  This could represent progression of a demyelinating process. 2. Subcortical T2 hyperintensities are otherwise stable. 3. Stable left parietal occipital infarct. 4. Minimal spondylosis of the cervical spine without change. There is no significant cord signal abnormality within the cervical spine.  MRI brain and cervical wwo contrast 09/28/2015: 1. 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 No abnormal enhancement to suggest active demyelination. 2. No cervical spinal cord lesions identified. 3. Slightly increased size of C5-6 disc protrusion without stenosis.   MRI thoracic spine wwo contrast 10/19/2016: 1 cm area of cord hyperintensity at T10 without enhancement. This is most consistent with multiple sclerosis. No other cord lesions Moderate disc degeneration at T11-12 without spinal stenosis.   MRI brain and cervical spine 10/31/2017:: 1. Stable mild cerebral white matter disease. No evidence of active demyelination. 2. 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  MRI thoracic spine 02/07/2018: No new abnormality since the prior  MRI. Small focus of T2 hyperintensity within the cord at T10 seen on the prior MRI is barely perceptible today. The lesion does not enhance. No new lesion is identified. The cord is otherwise unremarkable. Mild degenerative disc disease without central canal or foraminal narrowing  MRI brain and cervical spine 03/05/2019: 1. No acute intracranial abnormality and stable MRI appearance of the brain since 2019. Non-specific signal changes in the cerebral white matter plus areas of post ischemic appearing signal abnormality in the posterior left hemisphere and cerebellum. 2. Cervical spine MRI reported separately.   MRI thoracic and lumbar wwo contrast 03/20/2019: 1. Stable white matter lesion in the thoracic spinal cord at the level of T10 without enhancement consistent with a area of chronic demyelination. No new or enhancing spinal cord lesions. 2. No acute osseous injury of the thoracic spine. 3. No acute osseous injury of the lumbar spine. 4. Mild degenerative disease with disc height loss and a broad-based disc bulge at T11-12. 5. At L4-5 and L5-S1 there is a minimal broad-based disc bulge and mild left facet arthropathy.  IMPRESSION: Relapsing-remitting multiple sclerosis, diagnosed 2015 with long history of  various neurological symptoms since 1990s.  Disease burden involving cerebral disease, faint T10 lesion, and right cerebral peduncle lesion.  She has elected not to be on disease-modifying therapies.  - She is due for annual surveillance imaging with MRI brain, MRI cervical spine, and MRI thoracic spine wwo contrast.  - She is due for annual eye exam  - Continue vitamin D 4000U daily  Return to clinic in 1 year    Thank you for allowing me to participate in patient's care.  If I can answer any additional questions, I would be pleased to do so.    Sincerely,    Liliann File K. Posey Pronto, DO

## 2020-12-14 NOTE — Patient Instructions (Signed)
MRI brain, cervical spine, and thoracic spine will be ordered.

## 2021-01-04 ENCOUNTER — Other Ambulatory Visit: Payer: Self-pay

## 2021-01-04 ENCOUNTER — Ambulatory Visit
Admission: RE | Admit: 2021-01-04 | Discharge: 2021-01-04 | Disposition: A | Discharge status: Home / Self Care | Payer: Medicaid Other | Source: Ambulatory Visit | Attending: Neurology | Admitting: Neurology

## 2021-01-04 DIAGNOSIS — G35 Multiple sclerosis: Secondary | ICD-10-CM

## 2021-01-04 MED ORDER — GADOBENATE DIMEGLUMINE 529 MG/ML IV SOLN
18.0000 mL | Freq: Once | INTRAVENOUS | Status: AC | PRN
  Administered 2021-01-04: 18 mL via INTRAVENOUS

## 2021-01-11 ENCOUNTER — Other Ambulatory Visit: Payer: Medicaid Other

## 2021-01-21 ENCOUNTER — Ambulatory Visit: Payer: Medicaid Other | Admitting: Neurology

## 2021-01-25 ENCOUNTER — Other Ambulatory Visit: Payer: Medicaid Other

## 2021-02-12 ENCOUNTER — Ambulatory Visit
Admission: RE | Admit: 2021-02-12 | Discharge: 2021-02-12 | Disposition: A | Discharge status: Home / Self Care | Payer: Medicaid Other | Source: Ambulatory Visit | Attending: Neurology | Admitting: Neurology

## 2021-02-12 DIAGNOSIS — G35 Multiple sclerosis: Secondary | ICD-10-CM

## 2021-02-12 MED ORDER — GADOBENATE DIMEGLUMINE 529 MG/ML IV SOLN
18.0000 mL | Freq: Once | INTRAVENOUS | Status: AC | PRN
  Administered 2021-02-12: 18 mL via INTRAVENOUS

## 2021-07-13 NOTE — Progress Notes (Signed)
Follow-up Visit   Date: 07/14/21    Stephanie Webb MRN: [id number] DOB: Apr 15, 1966   Interim History: Stephanie Webb is a 56 y.o. right-handed Caucasian female with history of hypertension, GERD, irritable bowel syndrome, and palpitations returning to the clinic for follow-up of multiple sclerosis.     History of present illness: She has history of transient bilateral vision loss, double vision, generalized fatigue, numbness/tingling of the arms > legs, and muscle aches since the late 1990s. All of her symptoms are fluctuating and occur for several days at a time about 3-4 times per year.  In July 2014, she says she had an episode of bilateral leg numbness and was unable to lift her legs. She did not go to the ED and had out-patient MRI brain which showed white matter changes involving the cerebral hemisphere and upper cervical cord, concerning for demyelinating disease.  No active disease.  CSF testing was essentially normal, except elevated CSF glucose.  Particularly, there was no signs of inflammation.  MRI brain performed on 07/29/2014 showed one new right cerebral peduncle lesion without enhancement. Over the years, she has continued to have spells of numbness/tingling of the arms and legs and fatigue.  In 2018, MRI thoracic spine which shows 1 cm area of cord hyperintensity at T10 without enhancement.  No prior imaging to compare with, so it is difficult to determine the age of this lesion.  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  UPDATE 12/14/2020:  She is here for follow-up visit.  She continues to have intermittent numbness/tingling of the arms and legs.  Sometimes she has generalized fatigue and imbalance. She has not suffered any falls.  No focal weakness.  She feels that her legs get more numb and would like to have imaging to monitor her MS.  Her last imaging was in 2020.   UPDATE 07/13/2021:  She is here for follow-up visit.  MRI brain, cervical spine and thoracic  spine from the fall of 2022 was reviewed and does not show any new MS lesions. Over the past month, began feeling more sluggish and tired on the left side. She has heaviness and tingling over the left arm, leg, and left eyelid.  Symptoms are constant. ,  Medications:  Current Outpatient Medications on File Prior to Visit  Medication Sig Dispense Refill   Cholecalciferol (VITAMIN D) 2000 UNITS tablet Take 2,000 Units by mouth daily.     Collagen-Vitamin C-Biotin (COLLAGEN 1500/C PO) Take by mouth.     magnesium 30 MG tablet Take 30 mg by mouth 2 (two) times daily.     metFORMIN (GLUMETZA) 1000 MG (MOD) 24 hr tablet Take 1,000 mg by mouth daily with breakfast.     metoprolol succinate (TOPROL-XL) 50 MG 24 hr tablet Take 50 mg by mouth daily.     Multiple Vitamins-Minerals (QC WOMENS DAILY MULTIVITAMIN PO) Take by mouth daily.      Omega-3 Fatty Acids (FISH OIL) 1000 MG CAPS Take by mouth.     Zinc Sulfate (ZINC 15 PO) Take by mouth.     atorvastatin (LIPITOR) 20 MG tablet SMARTSIG:1 Tablet(s) By Mouth Every Evening (Patient not taking: Reported on 12/14/2020)     No current facility-administered medications on file prior to visit.    Allergies:  Allergies  Allergen Reactions   Banana Anaphylaxis   Cantaloupe Extract Allergy Skin Test Anaphylaxis   Cherry Anaphylaxis   Citrullus Vulgaris Anaphylaxis   Red Dye    Latex Itching  Sulfa Antibiotics Itching     Vital Signs:  BP 123/83    Pulse 68    Ht 5' 5.5" (1.664 m)    Wt 198 lb (89.8 kg)    SpO2 99%    BMI 32.45 kg/m     Neurological Exam: MENTAL STATUS including orientation to time, place, person, recent and remote memory, attention span and concentration, language, and fund of knowledge is normal.  Speech is not dysarthric.  CRANIAL NERVES:  Pupils equal round and reactive to light.  Normal conjugate, extra-ocular eye movements in all directions of gaze.  No ptosis.  Face is symmetric. Palate elevates symmetrically.  Tongue is  midline.  MOTOR:  Motor strength is 5/5 in all extremities there is intermittent give-way weakness on the left however with repeated testing strength is 5/5.  No pronator drift.  Tone is normal.    MSRs:  Reflexes are 2+/4 throughout.  SENSORY:  Intact to vibration throughout.    COORDINATION/GAIT:  Normal finger-to- nose-finger.  Intact rapid alternating movements bilaterally.  Gait narrow based and stable.  Tandem gait intact.    Data: MRI brain wwo contrast 09/05/2013: Multiple foci of white matter T2 signal abnormality in both cerebral hemispheres and in the upper cervical spinal cord. Findings are nonspecific but most concerning for demyelinating disease. No abnormal enhancement to suggest active demyelination.   MRI cervical spine wwo contrast 09/14/2013: 1. Cervical MRI fails to confirm definite abnormal signal or enhancement within the cervical cord.  2. Small disc protrusions at C5-6 and C6-7 without cord deformity or foraminal compromise.  Labs 08/08/2013: WBC 7.7, Hgb 14.2, PLT 220, Na 140, K 4.6, Cr 0.66, AST 25, ALT 43*, TSH 1.6, ESR 3, vitamin B12 553, ANA neg  CSF 02/20/2014:  R0 W3 G107* P26  IgG 1.3, oligoclonal bands absent, ACE 7, cytology negative  MRI brain and cervical spine wwo contrast 07/29/2014:   1. Question new 4 mm T2 lesion within the right cerebral peduncle.  This could represent progression of a demyelinating process. 2. Subcortical T2 hyperintensities are otherwise stable. 3. Stable left parietal occipital infarct. 4. Minimal spondylosis of the cervical spine without change. There is no significant cord signal abnormality within the cervical spine.  MRI brain and cervical wwo contrast 09/28/2015: 1. 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 No abnormal enhancement to suggest active demyelination. 2. No cervical spinal cord lesions identified. 3. Slightly increased size of C5-6 disc protrusion without stenosis.   MRI thoracic spine wwo  contrast 10/19/2016: 1 cm area of cord hyperintensity at T10 without enhancement. This is most consistent with multiple sclerosis. No other cord lesions Moderate disc degeneration at T11-12 without spinal stenosis.   MRI brain and cervical spine 10/31/2017:: 1. Stable mild cerebral white matter disease. No evidence of active demyelination. 2. 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  MRI thoracic spine 02/07/2018: No new abnormality since the prior MRI. Small focus of T2 hyperintensity within the cord at T10 seen on the prior MRI is barely perceptible today. The lesion does not enhance. No new lesion is identified. The cord is otherwise unremarkable. Mild degenerative disc disease without central canal or foraminal narrowing  MRI brain and cervical spine 03/05/2019: 1. No acute intracranial abnormality and stable MRI appearance of the brain since 2019. Non-specific signal changes in the cerebral white matter plus areas of post ischemic appearing signal abnormality in the posterior left hemisphere and cerebellum. 2. Cervical spine MRI reported separately.   MRI thoracic and  lumbar wwo contrast 03/20/2019: 1. Stable white matter lesion in the thoracic spinal cord at the level of T10 without enhancement consistent with a area of chronic demyelination. No new or enhancing spinal cord lesions. 2. No acute osseous injury of the thoracic spine. 3. No acute osseous injury of the lumbar spine. 4. Mild degenerative disease with disc height loss and a broad-based disc bulge at T11-12. 5. At L4-5 and L5-S1 there is a minimal broad-based disc bulge and mild left facet arthropathy.  MRI brain 01/04/2021: 1. Similar nonspecific T2 hyperintensities within the white matter. No new or enhancing lesions. 2. Similar remote left cerebral and bilateral cerebellar infarcts.   MRI cervical spine 01/04/2021: 1. No evidence of spinal cord demyelination. 2. Similar mild multilevel degenerative  change, as detailed above.   MRI thoracic spine 02/09/2021: Unchanged short-segment T2 hyperintense lesion within the dorsal aspect of the spinal cord at the T10 level. No new focus of signal abnormality is identified within the thoracic spinal cord. No abnormal cord enhancement to suggest active demyelination. Thoracic spondylosis, as outlined and unchanged. No significant spinal canal or foraminal stenosis.    IMPRESSION: Relapsing-remitting multiple sclerosis, diagnosed 2015 with long history of various neurological symptoms since 1990s.  Disease burden involving cerebral disease, faint T10 lesion, and right cerebral peduncle lesion.  She has elected not to be on disease-modifying therapies.  She had fluctuating neurological symptoms throughout the year, however has not had any MRI positive lesions since 2018.    Today, she presents with left sided weakness and paresthesias x 1 month.  To better evaluate whether these are related to MS exacerbation or not, I recommend that order MRI brain wwo contrast. If she does have new or active lesions, I will offer a course of high dose steroids.   Further recommendations pending results.  Follow-up in 8 months  Thank you for allowing me to participate in patient's care.  If I can answer any additional questions, I would be pleased to do so.    Sincerely,    Elianie Hubers K. Posey Pronto, DO

## 2021-07-14 ENCOUNTER — Other Ambulatory Visit: Payer: Self-pay

## 2021-07-14 ENCOUNTER — Ambulatory Visit: Payer: Medicaid Other | Admitting: Neurology

## 2021-07-14 ENCOUNTER — Encounter: Payer: Self-pay | Admitting: Neurology

## 2021-07-14 VITALS — BP 123/83 | HR 68 | Ht 65.5 in | Wt 198.0 lb

## 2021-07-14 DIAGNOSIS — G35 Multiple sclerosis: Secondary | ICD-10-CM | POA: Diagnosis not present

## 2021-07-14 NOTE — Patient Instructions (Addendum)
MRI brain wwo contrast.  Based on the results of your imaging we can decide the next step.   Follow-up in 8 months

## 2021-08-06 ENCOUNTER — Other Ambulatory Visit: Payer: Self-pay

## 2021-08-06 ENCOUNTER — Ambulatory Visit
Admission: RE | Admit: 2021-08-06 | Discharge: 2021-08-06 | Disposition: A | Discharge status: Home / Self Care | Payer: Medicaid Other | Source: Ambulatory Visit | Attending: Neurology | Admitting: Neurology

## 2021-08-06 DIAGNOSIS — G35 Multiple sclerosis: Secondary | ICD-10-CM

## 2021-08-06 MED ORDER — GADOBENATE DIMEGLUMINE 529 MG/ML IV SOLN
20.0000 mL | Freq: Once | INTRAVENOUS | Status: AC | PRN
  Administered 2021-08-06: 20 mL via INTRAVENOUS

## 2021-08-18 ENCOUNTER — Ambulatory Visit: Payer: Medicaid Other | Admitting: Neurology

## 2021-11-29 ENCOUNTER — Telehealth: Payer: Self-pay | Admitting: Neurology

## 2021-11-29 NOTE — Telephone Encounter (Signed)
Patient called and said she has MS and is having a flare up.   She is requesting to be seen sooner than October 2023 with Dr. Allena Katz but she is aware Dr. Allena Katz is on maternity leave.  Patient needs a call back from clinical staff, she said.

## 2021-12-01 ENCOUNTER — Encounter: Payer: Self-pay | Admitting: Physician Assistant

## 2021-12-01 ENCOUNTER — Ambulatory Visit: Payer: Medicaid Other | Admitting: Physician Assistant

## 2021-12-01 VITALS — Resp 20

## 2021-12-01 DIAGNOSIS — G35 Multiple sclerosis: Secondary | ICD-10-CM

## 2021-12-01 NOTE — Patient Instructions (Addendum)
MRI C spine wwo contrast.  Based on the results of your imaging we can decide the next step.   Follow-up in 8 months with Dr. Posey Pronto   We have sent a referral to [place] for your MRI and they will call you directly to schedule your appointment. They are located at [place]. If you need to contact them directly please call [phone].

## 2021-12-01 NOTE — Progress Notes (Signed)
Follow-up Visit   Date: 12/01/21    Stephanie Webb MRN: [id number] DOB: 16-Jun-1965   Interim History: Stephanie Webb is a 56 y.o. right-handed Caucasian female with history of hypertension, GERD, irritable bowel syndrome, and palpitations returning to the clinic for follow-up of multiple sclerosis.     Initial History of present illness: She has history of transient bilateral vision loss, double vision, generalized fatigue, numbness/tingling of the arms > legs, and muscle aches since the late 1990s. All of her symptoms are fluctuating and occur for several days at a time about 3-4 times per year.  In July 2014, she says she had an episode of bilateral leg numbness and was unable to lift her legs. She did not go to the ED and had out-patient MRI brain which showed white matter changes involving the cerebral hemisphere and upper cervical cord, concerning for demyelinating disease.  No active disease.  CSF testing was essentially normal, except elevated CSF glucose.  Particularly, there was no signs of inflammation.  MRI brain performed on 07/29/2014 showed one new right cerebral peduncle lesion without enhancement. Over the years, she has continued to have spells of numbness/tingling of the arms and legs and fatigue.  In 2018, MRI thoracic spine which shows 1 cm area of cord hyperintensity at T10 without enhancement.  No prior imaging to compare with, so it is difficult to determine the age of this lesion.  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  Update 12/01/2021 she is here for a follow-up visit.  She went to the chiropractor in May 2023 because she was having trigger point swelling and he adjusted the neck.  She has headaches at the time and they went away after this chiropractor visit.  He told her she had "fibromyalgia".  She is coming here because of clarity.  She states that she is worried about MS.  She reports some right facial numbness, feeling like "Novocain ", she also  has warm and fuzzy feeling in the patchy areas of the right forearm and right shin.   Her vision is good, occasionally she experiences double vision.  She has difficulty with bright lights.  No trouble swallowing.  She has urinary frequency and incomplete voids, that is attributed to diabetes.  She has generalized fatigue.  No falls.   UPDATE 07/13/2021:  She is here for follow-up visit.  MRI brain, cervical spine and thoracic spine from the fall of 2022 was reviewed and does not show any new MS lesions. Over the past month, began feeling more sluggish and tired on the left side. She has heaviness and tingling over the left arm, leg, and left eyelid.  Symptoms are constant.  UPDATE 12/14/2020:  She is here for follow-up visit.  She continues to have intermittent numbness/tingling of the arms and legs.  Sometimes she has generalized fatigue and imbalance. She has not suffered any falls.  No focal weakness.  She feels that her legs get more numb and would like to have imaging to monitor her MS.  Her last imaging was in 2020.      Medications:  Current Outpatient Medications on File Prior to Visit  Medication Sig Dispense Refill   atorvastatin (LIPITOR) 20 MG tablet SMARTSIG:1 Tablet(s) By Mouth Every Evening (Patient not taking: Reported on 12/14/2020)     Cholecalciferol (VITAMIN D) 2000 UNITS tablet Take 2,000 Units by mouth daily.     Collagen-Vitamin C-Biotin (COLLAGEN 1500/C PO) Take by mouth.  magnesium 30 MG tablet Take 30 mg by mouth 2 (two) times daily.     metFORMIN (GLUMETZA) 1000 MG (MOD) 24 hr tablet Take 1,000 mg by mouth daily with breakfast.     metoprolol succinate (TOPROL-XL) 50 MG 24 hr tablet Take 50 mg by mouth daily.     Multiple Vitamins-Minerals (QC WOMENS DAILY MULTIVITAMIN PO) Take by mouth daily.      Omega-3 Fatty Acids (FISH OIL) 1000 MG CAPS Take by mouth.     Zinc Sulfate (ZINC 15 PO) Take by mouth.     No current facility-administered medications on file prior to  visit.    Allergies:  Allergies  Allergen Reactions   Banana Anaphylaxis   Cantaloupe Extract Allergy Skin Test Anaphylaxis   Cherry Anaphylaxis   Citrullus Vulgaris Anaphylaxis   Red Dye    Latex Itching   Sulfa Antibiotics Itching     Vital Signs:  There were no vitals taken for this visit.    Neurological Exam: MENTAL STATUS including orientation to time, place, person, recent and remote memory, attention span and concentration, language, and fund of knowledge is normal.  Speech is not dysarthric.  CRANIAL NERVES:  Pupils equal round and reactive to light.  Normal conjugate, extra-ocular eye movements in all directions of gaze.  No ptosis.  Face is symmetric. Palate elevates symmetrically.  Tongue is midline.  MOTOR:  Motor strength is 5/5 in all extremities there is intermittent give-way weakness on the left however with repeated testing strength is 5/5.  No pronator drift.  Tone is normal.    MSRs:  Reflexes are 2+/4 throughout.  SENSORY:  Intact to vibration throughout.  The left forearm, she has minimal decreased sensation  COORDINATION/GAIT:  Normal finger-to- nose-finger.  Intact rapid alternating movements bilaterally.  Gait narrow based and stable.  Tandem gait intact.    Data: MRI brain wwo contrast 09/05/2013: Multiple foci of white matter T2 signal abnormality in both cerebral hemispheres and in the upper cervical spinal cord. Findings are nonspecific but most concerning for demyelinating disease. No abnormal enhancement to suggest active demyelination.   MRI cervical spine wwo contrast 09/14/2013: 1. Cervical MRI fails to confirm definite abnormal signal or enhancement within the cervical cord.  2. Small disc protrusions at C5-6 and C6-7 without cord deformity or foraminal compromise.  Labs 08/08/2013: WBC 7.7, Hgb 14.2, PLT 220, Na 140, K 4.6, Cr 0.66, AST 25, ALT 43*, TSH 1.6, ESR 3, vitamin B12 553, ANA neg  CSF 02/20/2014:  R0 W3 G107* P26  IgG 1.3,  oligoclonal bands absent, ACE 7, cytology negative  MRI brain and cervical spine wwo contrast 07/29/2014:   1. Question new 4 mm T2 lesion within the right cerebral peduncle.  This could represent progression of a demyelinating process. 2. Subcortical T2 hyperintensities are otherwise stable. 3. Stable left parietal occipital infarct. 4. Minimal spondylosis of the cervical spine without change. There is no significant cord signal abnormality within the cervical spine.  MRI brain and cervical wwo contrast 09/28/2015: 1. 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 No abnormal enhancement to suggest active demyelination. 2. No cervical spinal cord lesions identified. 3. Slightly increased size of C5-6 disc protrusion without stenosis.   MRI thoracic spine wwo contrast 10/19/2016: 1 cm area of cord hyperintensity at T10 without enhancement. This is most consistent with multiple sclerosis. No other cord lesions Moderate disc degeneration at T11-12 without spinal stenosis.   MRI brain and cervical spine 10/31/2017:: 1. Stable mild cerebral  white matter disease. No evidence of active demyelination. 2. 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  MRI thoracic spine 02/07/2018: No new abnormality since the prior MRI. Small focus of T2 hyperintensity within the cord at T10 seen on the prior MRI is barely perceptible today. The lesion does not enhance. No new lesion is identified. The cord is otherwise unremarkable. Mild degenerative disc disease without central canal or foraminal narrowing  MRI brain and cervical spine 03/05/2019: 1. No acute intracranial abnormality and stable MRI appearance of the brain since 2019. Non-specific signal changes in the cerebral white matter plus areas of post ischemic appearing signal abnormality in the posterior left hemisphere and cerebellum. 2. Cervical spine MRI reported separately.   MRI thoracic and lumbar wwo contrast  03/20/2019: 1. Stable white matter lesion in the thoracic spinal cord at the level of T10 without enhancement consistent with a area of chronic demyelination. No new or enhancing spinal cord lesions. 2. No acute osseous injury of the thoracic spine. 3. No acute osseous injury of the lumbar spine. 4. Mild degenerative disease with disc height loss and a broad-based disc bulge at T11-12. 5. At L4-5 and L5-S1 there is a minimal broad-based disc bulge and mild left facet arthropathy.  MRI brain 01/04/2021: 1. Similar nonspecific T2 hyperintensities within the white matter. No new or enhancing lesions. 2. Similar remote left cerebral and bilateral cerebellar infarcts.   MRI cervical spine 01/04/2021: 1. No evidence of spinal cord demyelination. 2. Similar mild multilevel degenerative change, as detailed above.   MRI thoracic spine 02/09/2021: Unchanged short-segment T2 hyperintense lesion within the dorsal aspect of the spinal cord at the T10 level. No new focus of signal abnormality is identified within the thoracic spinal cord. No abnormal cord enhancement to suggest active demyelination. Thoracic spondylosis, as outlined and unchanged. No significant spinal canal or foraminal stenosis.    IMPRESSION: Relapsing-remitting multiple sclerosis, diagnosed 2015 with long history of various neurological symptoms since 1990s.  Disease burden involving cerebral disease, faint T10 lesion, and right cerebral peduncle lesion.  She has elected not to be on disease-modifying therapies.  She had fluctuating neurological symptoms throughout the year, however has not had any MRI positive lesions since 2018.    Today, she presents with left sided paresthesias that affects the left side of her face.  We will order an MRI of the C-spine with and without contrast to further evaluate.  If no new findings are noted such as lesions, she will follow-up with Dr. Posey Pronto and her scheduled visit in October Further  recommendations pending results.  Follow-up in 8 months  Thank you for allowing me to participate in patient's care.  If I can answer any additional questions, I would be pleased to do so.    Sincerely,   Sharene Butters, PA-C

## 2021-12-10 ENCOUNTER — Encounter: Payer: Self-pay | Admitting: Cardiology

## 2021-12-14 ENCOUNTER — Other Ambulatory Visit: Payer: Self-pay | Admitting: Family Medicine

## 2021-12-14 DIAGNOSIS — Z1231 Encounter for screening mammogram for malignant neoplasm of breast: Secondary | ICD-10-CM

## 2021-12-16 ENCOUNTER — Ambulatory Visit
Admission: RE | Admit: 2021-12-16 | Discharge: 2021-12-16 | Disposition: A | Discharge status: Home / Self Care | Payer: Medicaid Other | Source: Ambulatory Visit | Attending: Physician Assistant | Admitting: Physician Assistant

## 2021-12-16 MED ORDER — GADOBENATE DIMEGLUMINE 529 MG/ML IV SOLN
18.0000 mL | Freq: Once | INTRAVENOUS | Status: AC | PRN
  Administered 2021-12-16: 18 mL via INTRAVENOUS

## 2021-12-20 ENCOUNTER — Ambulatory Visit: Payer: Medicaid Other | Admitting: Neurology

## 2021-12-20 NOTE — Progress Notes (Signed)
Please inform the patient that the C-spine MR I just shows some degenerative disc changes, similar to the prior MRI, without any significant narrowing, or Plavix.  Thank you

## 2022-02-04 ENCOUNTER — Encounter: Payer: Self-pay | Admitting: Cardiology

## 2022-02-04 ENCOUNTER — Other Ambulatory Visit
Admission: RE | Admit: 2022-02-04 | Discharge: 2022-02-04 | Disposition: A | Discharge status: Home / Self Care | Payer: Medicaid Other | Source: Ambulatory Visit | Attending: Cardiology | Admitting: Cardiology

## 2022-02-04 ENCOUNTER — Ambulatory Visit: Payer: Medicaid Other | Attending: Cardiology | Admitting: Cardiology

## 2022-02-04 VITALS — BP 118/70 | HR 70 | Ht 65.0 in | Wt 195.0 lb

## 2022-02-04 DIAGNOSIS — R072 Precordial pain: Secondary | ICD-10-CM | POA: Diagnosis present

## 2022-02-04 DIAGNOSIS — I10 Essential (primary) hypertension: Secondary | ICD-10-CM | POA: Diagnosis not present

## 2022-02-04 LAB — BASIC METABOLIC PANEL
Anion gap: 6 (ref 5–15)
BUN: 16 mg/dL (ref 6–20)
CO2: 30 mmol/L (ref 22–32)
Calcium: 9.6 mg/dL (ref 8.9–10.3)
Chloride: 106 mmol/L (ref 98–111)
Creatinine, Ser: 0.63 mg/dL (ref 0.44–1.00)
GFR, Estimated: >60 mL/min (ref >60)
Glucose, Bld: 126 mg/dL — ABNORMAL HIGH (ref 70–99)
Potassium: 4.7 mmol/L (ref 3.5–5.1)
Sodium: 142 mmol/L (ref 135–145)

## 2022-02-04 MED ORDER — IVABRADINE HCL 5 MG PO TABS: 10.0000 mg | ORAL_TABLET | Freq: Once | ORAL | 0 refills | Status: AC

## 2022-02-04 MED ORDER — METOPROLOL TARTRATE 100 MG PO TABS: 100.0000 mg | ORAL_TABLET | Freq: Once | ORAL | 0 refills | Status: DC

## 2022-02-04 NOTE — Patient Instructions (Signed)
Medication Instructions:   Your physician recommends that you continue on your current medications as directed. Please refer to the Current Medication list given to you today.  *If you need a refill on your cardiac medications before your next appointment, please call your pharmacy*   Lab Work:  __XX___Please go to the [facility] after your appointment today for a lab (BMP) draw.   Testing/Procedures:  Your physician has requested that you have cardiac CT. Cardiac computed tomography (CT) is a painless test that uses an x-ray machine to take clear, detailed pictures of your heart.    Your cardiac CT will be scheduled at:  [place]  Monday 02/14/22 at 2:30 PM  Please arrive 15 mins early for check-in and test prep.    Please follow these instructions carefully (unless otherwise directed):    On the Night Before the Test: Be sure to Drink plenty of water. Do not consume any caffeinated/decaffeinated beverages or chocolate 12 hours prior to your test.    On the Day of the Test: Drink plenty of water until 1 hour prior to the test. Do not eat any food 4 hours prior to the test. You may take your regular medications prior to the test.  Take metoprolol (Lopressor) 100 MG two hours prior to test. Take Ivabradine (Corlanor) 10 MG two hours prior to test. HOLD Furosemide/Hydrochlorothiazide morning of the test. FEMALES- please wear underwire-free bra if available, avoid dresses & tight clothing        After the Test: Drink plenty of water. After receiving IV contrast, you may experience a mild flushed feeling. This is normal. On occasion, you may experience a mild rash up to 24 hours after the test. This is not dangerous. If this occurs, you can take Benadryl 25 mg and increase your fluid intake. If you experience trouble breathing, this can be serious. If it is severe call 911 IMMEDIATELY. If it is mild, please call our office. If you take any of these  medications: Glipizide/Metformin, Avandament, Glucavance, please do not take 48 hours after completing test unless otherwise instructed.  Please allow 2-4 weeks for scheduling of routine cardiac CTs. Some insurance companies require a pre-authorization which may delay scheduling of this test.   For non-scheduling related questions, please contact the cardiac imaging nurse navigator should you have any questions/concerns: Marchia Bond, Cardiac Imaging Nurse Navigator Gordy Clement, Cardiac Imaging Nurse Navigator [facility] Direct Office Dial: [phone]   For scheduling needs, including cancellations and rescheduling, please call [place], [phone].     Follow-Up: At [facility], you and your health needs are our priority.  As part of our continuing mission to provide you with exceptional heart care, we have created designated Provider Care Teams.  These Care Teams include your primary Cardiologist (physician) and Advanced Practice Providers (APPs -  Physician Assistants and Nurse Practitioners) who all work together to provide you with the care you need, when you need it.  We recommend signing up for the patient portal called "MyChart".  Sign up information is provided on this After Visit Summary.  MyChart is used to connect with patients for Virtual Visits (Telemedicine).  Patients are able to view lab/test results, encounter notes, upcoming appointments, etc.  Non-urgent messages can be sent to your provider as well.   To learn more about what you can do with MyChart, go to [email or web address].    Your next appointment:   Follow up after testing   The  format for your next appointment:   In Person  Provider:   You may see Kate Sable, MD or one of the following Advanced Practice Providers on your designated Care Team:   Murray Hodgkins, NP Christell Faith, PA-C Cadence Kathlen Mody, PA-C Gerrie Nordmann, NP      Important Information About  Sugar

## 2022-02-04 NOTE — Progress Notes (Addendum)
Cardiology Office Note:    Date:  02/04/2022   ID:  Stephanie Webb, DOB Sep 27, 1965, MRN [id number]  PCP:  Lavonne Chick, NP (Inactive)  [facility] Cardiologist:  Kate Sable, MD  [place] Electrophysiologist:  None   Referring MD: Gennette Pac, [place]   Chief Complaint  Patient presents with   Other    Patient reports chest pain, face numbness and arm heaviness. Patient reports she also has MS and is not sure if its cardiac or MS related. Meds reviewed verbally with patient.     History of Present Illness:    Stephanie Webb is a 56 y.o. female with a hx of hypertension,  diabetes, multiple sclerosis who presents due to chest pain. Symptoms occur randomly, not associated with exertion.  She is unsure if her symptoms are secondary to multiple sclerosis flares or cardiac disease due to her risk factors.  Sometimes get chest tightness, radiating down her left arm and numbness in her jaw.  Previously seen for cardiac murmur and palpitations, echo and cardiac monitor were unrevealing.   Prior notes Echo 04/2020 EF 60 to 65%, mild MR. Cardiac monitor 03/2020 no significant arrhythmias   Past Medical History:  Diagnosis Date   Anxiety    Cervical cancer ([place]) 1992   Diabetes mellitus without complication ([place])    Diverticulitis    GERD (gastroesophageal reflux disease)    Heart murmur    Hyperlipidemia    Hypertension    takes Metoprolol for irregular heart beat   Irregular heart beat    takes Metoprolol for this   Irritable bowel syndrome    Multiple sclerosis ([place])    Pneumonia 2008   Status post dilation of esophageal narrowing     Past Surgical History:  Procedure Laterality Date   APPENDECTOMY     CESAREAN SECTION     Ovary removal      Current Medications: Current Meds  Medication Sig   ivabradine (CORLANOR) 5 MG TABS tablet Take 2 tablets (10 mg total) by mouth once for 1 dose. Take 2 hours prior to your CT scan   metFORMIN (GLUMETZA) 1000 MG  (MOD) 24 hr tablet Take 1,000 mg by mouth daily with breakfast.   metoprolol succinate (TOPROL-XL) 50 MG 24 hr tablet Take 50 mg by mouth daily.   metoprolol tartrate (LOPRESSOR) 100 MG tablet Take 1 tablet (100 mg total) by mouth once for 1 dose. Take 2 hours prior to your CT scan.     Allergies:   Banana, Cantaloupe extract allergy skin test, Cherry, Citrullus vulgaris, Red dye, Latex, and Sulfa antibiotics   Social History   Socioeconomic History   Marital status: Married    Spouse name: Not on file   Number of children: 4   Years of education: 14   Highest education level: Not on file  Occupational History   Occupation: homemaker  Tobacco Use   Smoking status: Former    Years: 10.00    Types: Cigarettes    Quit date: 06/06/1994    Years since quitting: 27.6   Smokeless tobacco: Never  Vaping Use   Vaping Use: Never used  Substance and Sexual Activity   Alcohol use: Yes    Alcohol/week: 0.0 standard drinks of alcohol    Comment: Occasional    Drug use: No    Comment: Recreational drug drug use in 1990s - marijuana, cocaine, opium, etc   Sexual activity: Yes    Birth control/protection: None  Other Topics Concern  Not on file  Social History Narrative      She lives with husband and two children. She has two grown children living outside of the home.   Highest level of education:  Associates degree   Right handed   Two story home   Social Determinants of Health   Financial Resource Strain: Not on file  Food Insecurity: Not on file  Transportation Needs: Not on file  Physical Activity: Not on file  Stress: Not on file  Social Connections: Not on file     Family History: The patient's family history includes Healthy in her brother; Heart attack (age of onset: 26) in her maternal grandfather; Heart disease in her maternal grandfather; Hypertension in her mother; Lung cancer in her father. There is no history of Colon cancer, Colon polyps, Esophageal cancer,  Diabetes, Rectal cancer, or Stomach cancer.  ROS:   Please see the history of present illness.     All other systems reviewed and are negative.  EKGs/Labs/Other Studies Reviewed:    The following studies were reviewed today:   EKG:  EKG is  ordered today.  The ekg ordered today demonstrates normal sinus rhythm  Recent Labs: No results found for requested labs within last 365 days.  Recent Lipid Panel No results found for: "CHOL", "TRIG", "HDL", "CHOLHDL", "VLDL", "LDLCALC", "LDLDIRECT"   Risk Assessment/Calculations:      Physical Exam:    VS:  BP 118/70 (BP Location: Left Arm, Patient Position: Sitting, Cuff Size: Normal)   Pulse 70   Ht '5\' 5"'$  (1.651 m)   Wt 195 lb (88.5 kg)   SpO2 98%   BMI 32.45 kg/m     Wt Readings from Last 3 Encounters:  02/04/22 195 lb (88.5 kg)  07/14/21 198 lb (89.8 kg)  12/14/20 194 lb (88 kg)     GEN:  Well nourished, well developed in no acute distress HEENT: Normal NECK: No JVD; No carotid bruits CARDIAC: RRR, faint systolic murmur, rubs, gallops RESPIRATORY:  Clear to auscultation without rales, wheezing or rhonchi  ABDOMEN: Soft, non-tender, non-distended MUSCULOSKELETAL:  No edema; No deformity  SKIN: Warm and dry NEUROLOGIC:  Alert and oriented x 3 PSYCHIATRIC:  Normal affect   ASSESSMENT:    1. Precordial pain   2. Primary hypertension    PLAN:    In order of problems listed above:  Chest pain, history of hypertension, hyperlipidemia, obtain coronary CTA to evaluate CAD.  Echo 04/2020 EF 60 to 65% hypertension, BP controlled.  Continue metoprolol.  Follow-up after cardiac testing  Total encounter time 35 minutes  Greater than 50% was spent in counseling and coordination of care with the patient   Medication Adjustments/Labs and Tests Ordered: Current medicines are reviewed at length with the patient today.  Concerns regarding medicines are outlined above.  Orders Placed This Encounter  Procedures   CT CORONARY  MORPH W/CTA COR W/SCORE W/CA W/CM &/OR WO/CM   Basic metabolic panel   EKG 59-DGLO   Meds ordered this encounter  Medications   metoprolol tartrate (LOPRESSOR) 100 MG tablet    Sig: Take 1 tablet (100 mg total) by mouth once for 1 dose. Take 2 hours prior to your CT scan.    Dispense:  1 tablet    Refill:  0   ivabradine (CORLANOR) 5 MG TABS tablet    Sig: Take 2 tablets (10 mg total) by mouth once for 1 dose. Take 2 hours prior to your CT scan    Dispense:  2 tablet    Refill:  0    Patient Instructions  Medication Instructions:   Your physician recommends that you continue on your current medications as directed. Please refer to the Current Medication list given to you today.  *If you need a refill on your cardiac medications before your next appointment, please call your pharmacy*   Lab Work:  __XX___Please go to the [facility] after your appointment today for a lab (BMP) draw.   Testing/Procedures:  Your physician has requested that you have cardiac CT. Cardiac computed tomography (CT) is a painless test that uses an x-ray machine to take clear, detailed pictures of your heart.    Your cardiac CT will be scheduled at:  [facility]  Monday 02/14/22 at 2:30 PM  Please arrive 15 mins early for check-in and test prep.    Please follow these instructions carefully (unless otherwise directed):    On the Night Before the Test: Be sure to Drink plenty of water. Do not consume any caffeinated/decaffeinated beverages or chocolate 12 hours prior to your test.    On the Day of the Test: Drink plenty of water until 1 hour prior to the test. Do not eat any food 4 hours prior to the test. You may take your regular medications prior to the test.  Take metoprolol (Lopressor) 100 MG two hours prior to test. Take Ivabradine (Corlanor) 10 MG two hours prior to test. HOLD Furosemide/Hydrochlorothiazide morning of the test. FEMALES- please wear underwire-free bra  if available, avoid dresses & tight clothing        After the Test: Drink plenty of water. After receiving IV contrast, you may experience a mild flushed feeling. This is normal. On occasion, you may experience a mild rash up to 24 hours after the test. This is not dangerous. If this occurs, you can take Benadryl 25 mg and increase your fluid intake. If you experience trouble breathing, this can be serious. If it is severe call 911 IMMEDIATELY. If it is mild, please call our office. If you take any of these medications: Glipizide/Metformin, Avandament, Glucavance, please do not take 48 hours after completing test unless otherwise instructed.  Please allow 2-4 weeks for scheduling of routine cardiac CTs. Some insurance companies require a pre-authorization which may delay scheduling of this test.   For non-scheduling related questions, please contact the cardiac imaging nurse navigator should you have any questions/concerns: Marchia Bond, Cardiac Imaging Nurse Navigator Gordy Clement, Cardiac Imaging Nurse Navigator [facility] Direct Office Dial: [phone]   For scheduling needs, including cancellations and rescheduling, please call [place], [phone].     Follow-Up: At [facility], you and your health needs are our priority.  As part of our continuing mission to provide you with exceptional heart care, we have created designated Provider Care Teams.  These Care Teams include your primary Cardiologist (physician) and Advanced Practice Providers (APPs -  Physician Assistants and Nurse Practitioners) who all work together to provide you with the care you need, when you need it.  We recommend signing up for the patient portal called "MyChart".  Sign up information is provided on this After Visit Summary.  MyChart is used to connect with patients for Virtual Visits (Telemedicine).  Patients are able to view lab/test results, encounter notes, upcoming  appointments, etc.  Non-urgent messages can be sent to your provider as well.   To learn more about what you can do with MyChart, go to [email or web address].  Your next appointment:   Follow up after testing   The format for your next appointment:   In Person  Provider:   You may see Kate Sable, MD or one of the following Advanced Practice Providers on your designated Care Team:   Murray Hodgkins, NP Christell Faith, PA-C Cadence Kathlen Mody, PA-C Gerrie Nordmann, NP      Important Information About Sugar         Signed, Kate Sable, MD  02/04/2022 11:38 AM    [place]

## 2022-02-11 ENCOUNTER — Telehealth (HOSPITAL_COMMUNITY): Payer: Self-pay | Admitting: Emergency Medicine

## 2022-02-11 NOTE — Telephone Encounter (Signed)
Attempted to call patient regarding upcoming cardiac CT appointment. °Left message on voicemail with name and callback number °Deniese Oberry RN Navigator Cardiac Imaging °[facility] °[phone] Office °[phone] Cell ° °

## 2022-02-14 ENCOUNTER — Ambulatory Visit
Admission: RE | Admit: 2022-02-14 | Discharge: 2022-02-14 | Disposition: A | Discharge status: Home / Self Care | Payer: Medicaid Other | Source: Ambulatory Visit | Attending: Cardiology | Admitting: Cardiology

## 2022-02-14 DIAGNOSIS — R072 Precordial pain: Secondary | ICD-10-CM | POA: Insufficient documentation

## 2022-02-14 MED ORDER — IOHEXOL 350 MG/ML SOLN
100.0000 mL | Freq: Once | INTRAVENOUS | Status: AC | PRN
  Administered 2022-02-14: 100 mL via INTRAVENOUS

## 2022-02-14 MED ORDER — METOPROLOL TARTRATE 5 MG/5ML IV SOLN
INTRAVENOUS | Status: AC
  Filled 2022-02-14: qty 10

## 2022-02-14 MED ORDER — METOPROLOL TARTRATE 5 MG/5ML IV SOLN
10.0000 mg | Freq: Once | INTRAVENOUS | Status: AC
  Administered 2022-02-14: 10 mg via INTRAVENOUS
  Filled 2022-02-14: qty 10

## 2022-02-14 MED ORDER — NITROGLYCERIN 0.4 MG SL SUBL
0.8000 mg | SUBLINGUAL_TABLET | Freq: Once | SUBLINGUAL | Status: AC
  Administered 2022-02-14: 0.8 mg via SUBLINGUAL
  Filled 2022-02-14: qty 25

## 2022-02-14 NOTE — Progress Notes (Signed)
Patient tolerated procedure well. Ambulate w/o difficulty. Denies any lightheadedness or being dizzy. Pt denies any pain at this time. Sitting in chair, pt is encouraged to drink additional water throughout the day and reason explained to patient. Patient verbalized understanding and all questions answered. ABC intact. No further needs at this time. Discharge from procedure area w/o issues.  

## 2022-02-16 ENCOUNTER — Telehealth: Payer: Self-pay

## 2022-02-16 NOTE — Telephone Encounter (Signed)
Patient returned call and I reviewed result with her as seen below:  Stephanie Sable, MD  Kavin Leech, RN Mild nonobstructive coronary artery disease, no significant stenosis to suggest etiology of chest pain.

## 2022-02-16 NOTE — Telephone Encounter (Signed)
Left a VM for patient requesting a call back.

## 2022-02-16 NOTE — Telephone Encounter (Signed)
Pt returning nurses call regarding results. Please advise 

## 2022-03-04 ENCOUNTER — Ambulatory Visit: Payer: Medicaid Other | Admitting: Neurology

## 2022-03-14 ENCOUNTER — Ambulatory Visit: Payer: Medicaid Other | Admitting: Neurology

## 2022-03-24 NOTE — Progress Notes (Deleted)
Requested PCP notes.  

## 2022-03-25 ENCOUNTER — Encounter: Payer: Self-pay | Admitting: Cardiology

## 2022-03-25 ENCOUNTER — Ambulatory Visit: Payer: Medicaid Other | Attending: Cardiology | Admitting: Cardiology

## 2022-03-25 ENCOUNTER — Telehealth: Payer: Self-pay

## 2022-03-25 VITALS — BP 112/70 | HR 58 | Ht 65.0 in | Wt 195.6 lb

## 2022-03-25 DIAGNOSIS — I10 Essential (primary) hypertension: Secondary | ICD-10-CM

## 2022-03-25 DIAGNOSIS — E7800 Pure hypercholesterolemia, unspecified: Secondary | ICD-10-CM

## 2022-03-25 DIAGNOSIS — I2510 Atherosclerotic heart disease of native coronary artery without angina pectoris: Secondary | ICD-10-CM | POA: Diagnosis not present

## 2022-03-25 MED ORDER — ATORVASTATIN CALCIUM 40 MG PO TABS: 40.0000 mg | ORAL_TABLET | Freq: Every day | ORAL | 5 refills | Status: DC

## 2022-03-25 MED ORDER — ASPIRIN 81 MG PO TBEC: 81.0000 mg | DELAYED_RELEASE_TABLET | Freq: Every day | ORAL | 3 refills | Status: AC

## 2022-03-25 NOTE — Telephone Encounter (Signed)
Pt is returning call. Transferred to [employer], Therapist, sports.

## 2022-03-25 NOTE — Patient Instructions (Signed)
Medication Instructions:    START taking Aspirin 81 MG once a day.  *If you need a refill on your cardiac medications before your next appointment, please call your pharmacy*    Follow-Up: At [facility], you and your health needs are our priority.  As part of our continuing mission to provide you with exceptional heart care, we have created designated Provider Care Teams.  These Care Teams include your primary Cardiologist (physician) and Advanced Practice Providers (APPs -  Physician Assistants and Nurse Practitioners) who all work together to provide you with the care you need, when you need it.  We recommend signing up for the patient portal called "MyChart".  Sign up information is provided on this After Visit Summary.  MyChart is used to connect with patients for Virtual Visits (Telemedicine).  Patients are able to view lab/test results, encounter notes, upcoming appointments, etc.  Non-urgent messages can be sent to your provider as well.   To learn more about what you can do with MyChart, go to [email or web address].    Your next appointment:   1 year(s)  The format for your next appointment:   In Person  Provider:   Kate Sable, MD    Other Instructions    Important Information About Sugar

## 2022-03-25 NOTE — Progress Notes (Signed)
Cardiology Office Note:    Date:  03/25/2022   ID:  Stephanie Webb, DOB [birth year], MRN [id number]  PCP:  Lavonne Chick, NP (Inactive)  [facility] Cardiologist:  Kate Sable, MD  [place] Electrophysiologist:  None   Referring MD: No ref. provider found   Chief Complaint  Patient presents with   Follow-up    Testing follow up, no new concerns    History of Present Illness:    Stephanie Webb is a 56 y.o. female with a hx of hypertension, diabetes, multiple sclerosis, former smoker who presents for follow-up.  Previously seen due to chest pain.  Due to risk factors, coronary CTA was obtained.  Presents for results.  States doing okay, no new concerns.  Prior notes Echo 04/2020 EF 60 to 65%, mild MR. Cardiac monitor 03/2020 no significant arrhythmias   Past Medical History:  Diagnosis Date   Anxiety    Cervical cancer ([place]) 1992   Diabetes mellitus without complication ([place])    Diverticulitis    GERD (gastroesophageal reflux disease)    Heart murmur    Hyperlipidemia    Hypertension    takes Metoprolol for irregular heart beat   Irregular heart beat    takes Metoprolol for this   Irritable bowel syndrome    Multiple sclerosis ([place])    Pneumonia 2008   Status post dilation of esophageal narrowing     Past Surgical History:  Procedure Laterality Date   APPENDECTOMY     CESAREAN SECTION     Ovary removal      Current Medications: Current Meds  Medication Sig   aspirin EC 81 MG tablet Take 1 tablet (81 mg total) by mouth daily. Swallow whole.   Cholecalciferol (VITAMIN D) 2000 UNITS tablet Take 2,000 Units by mouth daily.   Collagen-Vitamin C-Biotin (COLLAGEN 1500/C PO) Take by mouth.   magnesium 30 MG tablet Take 30 mg by mouth 2 (two) times daily.   metFORMIN (GLUMETZA) 1000 MG (MOD) 24 hr tablet Take 1,000 mg by mouth daily with breakfast.   metoprolol succinate (TOPROL-XL) 50 MG 24 hr tablet Take 50 mg by mouth daily.   Multiple  Vitamins-Minerals (QC WOMENS DAILY MULTIVITAMIN PO) Take by mouth daily.    Omega-3 Fatty Acids (FISH OIL) 1000 MG CAPS Take by mouth.   Zinc Sulfate (ZINC 15 PO) Take by mouth.     Allergies:   Banana, Cantaloupe extract allergy skin test, Cherry, Citrullus vulgaris, Red dye, Latex, and Sulfa antibiotics   Social History   Socioeconomic History   Marital status: Married    Spouse name: Not on file   Number of children: 4   Years of education: 14   Highest education level: Not on file  Occupational History   Occupation: homemaker  Tobacco Use   Smoking status: Former    Years: 10.00    Types: Cigarettes    Quit date: 06/06/1994    Years since quitting: 27.8   Smokeless tobacco: Never  Vaping Use   Vaping Use: Never used  Substance and Sexual Activity   Alcohol use: Yes    Alcohol/week: 0.0 standard drinks of alcohol    Comment: Occasional    Drug use: Yes    Types: Marijuana    Comment: Occasional use, Recreational drug drug use in 1990s - marijuana, cocaine, opium, etc   Sexual activity: Yes    Birth control/protection: None  Other Topics Concern   Not on file  Social History Narrative  She lives with husband and two children. She has two grown children living outside of the home.   Highest level of education:  Associates degree   Right handed   Two story home   Social Determinants of Health   Financial Resource Strain: Not on file  Food Insecurity: Not on file  Transportation Needs: Not on file  Physical Activity: Not on file  Stress: Not on file  Social Connections: Not on file     Family History: The patient's family history includes Healthy in her brother; Heart attack (age of onset: 1) in her maternal grandfather; Heart disease in her maternal grandfather; Hypertension in her mother; Lung cancer in her father. There is no history of Colon cancer, Colon polyps, Esophageal cancer, Diabetes, Rectal cancer, or Stomach cancer.  ROS:   Please see the  history of present illness.     All other systems reviewed and are negative.  EKGs/Labs/Other Studies Reviewed:    The following studies were reviewed today:   EKG:  EKG is  ordered today.  The ekg ordered today demonstrates sinus bradycardia, heart rate 58.  Recent Labs: 02/04/2022: BUN 16; Creatinine, Ser 0.63; Potassium 4.7; Sodium 142  Recent Lipid Panel No results found for: "CHOL", "TRIG", "HDL", "CHOLHDL", "VLDL", "LDLCALC", "LDLDIRECT"   Risk Assessment/Calculations:      Physical Exam:    VS:  BP 112/70 (BP Location: Left Arm, Patient Position: Sitting, Cuff Size: Normal)   Pulse (!) 58   Ht '5\' 5"'$  (1.651 m)   Wt 195 lb 9.6 oz (88.7 kg)   SpO2 98%   BMI 32.55 kg/m     Wt Readings from Last 3 Encounters:  03/25/22 195 lb 9.6 oz (88.7 kg)  02/04/22 195 lb (88.5 kg)  07/14/21 198 lb (89.8 kg)     GEN:  Well nourished, well developed in no acute distress HEENT: Normal NECK: No JVD; No carotid bruits CARDIAC: RRR, faint systolic murmur, rubs, gallops RESPIRATORY:  Clear to auscultation without rales, wheezing or rhonchi  ABDOMEN: Soft, non-tender, non-distended MUSCULOSKELETAL:  No edema; No deformity  SKIN: Warm and dry NEUROLOGIC:  Alert and oriented x 3 PSYCHIATRIC:  Normal affect   ASSESSMENT:    1. Coronary artery disease involving native coronary artery of native heart without angina pectoris   2. Primary hypertension    PLAN:    In order of problems listed above:  coronary CTA showed nonobstructive proximal OM1 disease 25%, minimal less than 25% proximal LAD stenosis.  Echo 04/2020 EF 60 to 65%.  Start aspirin 81 mg, request fasting lipid profile from PCP.  If LDL not at goal, plan to start statin. hypertension, BP controlled.  Continue Toprol-XL 50 mg daily  Follow-up in 1 year.  Total encounter time 35 minutes  Greater than 50% was spent in counseling and coordination of care with the patient   Medication Adjustments/Labs and Tests  Ordered: Current medicines are reviewed at length with the patient today.  Concerns regarding medicines are outlined above.  Orders Placed This Encounter  Procedures   EKG 12-Lead   Meds ordered this encounter  Medications   aspirin EC 81 MG tablet    Sig: Take 1 tablet (81 mg total) by mouth daily. Swallow whole.    Dispense:  90 tablet    Refill:  3    Patient Instructions  Medication Instructions:    START taking Aspirin 81 MG once a day.  *If you need a refill on your cardiac medications before your  next appointment, please call your pharmacy*    Follow-Up: At [facility], you and your health needs are our priority.  As part of our continuing mission to provide you with exceptional heart care, we have created designated Provider Care Teams.  These Care Teams include your primary Cardiologist (physician) and Advanced Practice Providers (APPs -  Physician Assistants and Nurse Practitioners) who all work together to provide you with the care you need, when you need it.  We recommend signing up for the patient portal called "MyChart".  Sign up information is provided on this After Visit Summary.  MyChart is used to connect with patients for Virtual Visits (Telemedicine).  Patients are able to view lab/test results, encounter notes, upcoming appointments, etc.  Non-urgent messages can be sent to your provider as well.   To learn more about what you can do with MyChart, go to [email or web address].    Your next appointment:   1 year(s)  The format for your next appointment:   In Person  Provider:   Kate Sable, MD    Other Instructions    Important Information About Sugar         Signed, Kate Sable, MD  03/25/2022 2:49 PM    [place]

## 2022-03-25 NOTE — Telephone Encounter (Signed)
Spoke with patient and she verbalized understanding and agreed with plan. She will start Atorvastatin 40 MG once a day and get a fasting lipid level in 3 months.

## 2022-03-25 NOTE — Telephone Encounter (Signed)
Patient was seen in office today and after visit we requested her Lipid panel from her PCP. Lipid panel results scanned into [employer]. Reviewed them with Dr.Agbor-Etang and he recommended that he patient start taking Atorvastatin 40 MG once a day. Her LDL was 153.   Called patient and requested that she call me back. No DPR on file to leave a detailed VM.

## 2022-05-25 ENCOUNTER — Telehealth: Payer: Self-pay

## 2022-05-25 ENCOUNTER — Other Ambulatory Visit: Payer: Self-pay

## 2022-05-25 DIAGNOSIS — G35 Multiple sclerosis: Secondary | ICD-10-CM

## 2022-05-25 NOTE — Telephone Encounter (Signed)
Called and spoke with patient informed that we had received for evaluation from her employer. Per Dr. Posey Pronto she will need to go to OT to have functional capacity evaluation done before  this can be completed. Sent over referral to physical to  get patient start for this, ask pt call us back if the location didn't call her regarding an appt. Forms are on Mahina's desk.

## 2022-06-20 ENCOUNTER — Telehealth: Payer: Self-pay

## 2022-06-20 ENCOUNTER — Ambulatory Visit: Payer: Medicaid Other | Admitting: Dermatology

## 2022-06-20 NOTE — Telephone Encounter (Signed)
Patient called in stating that she hasn't heard back about the referral for OT, provided referral info. Patient states she wanted to mention that she has been having L eye pressure, blurriness, and slight sensitivity for 3 weeks. Seen eye doctor on 1/2 but they told her that she just needed a new prescription for glasses. Patient does not believe that is the reason and wants to know if we would be able to see her for the issue.

## 2022-06-21 NOTE — Telephone Encounter (Signed)
Patient will have to call [phone] to schedule with PT. Called patient and left a message for a call back. To provide phone number to her.

## 2022-06-22 NOTE — Telephone Encounter (Signed)
Called and spoke to patient and provided her with [place] regional contact information. She stated she called them and was informed that the scheduler is going to contact her. Patient was also reminded of her appt on the 06/27/22 at 8:30 am and Dr. Posey Pronto will address her eye concerns then. Patient verbalized understanding and had no further questions or concerns.

## 2022-06-23 ENCOUNTER — Ambulatory Visit: Payer: Medicaid Other | Admitting: Dermatology

## 2022-06-24 NOTE — Progress Notes (Signed)
Follow-up Visit   Date: 06/27/22    Stephanie Webb MRN: [id number] DOB: July 04, 1965   Interim History: Stephanie Webb is a 57 y.o. right-handed Caucasian female with history of hypertension, GERD, irritable bowel syndrome, and palpitations returning to the clinic for follow-up of multiple sclerosis.     IMPRESSION: Relapsing-remitting multiple sclerosis, diagnosed 2015 with long history of various neurological symptoms since 1990s.  Disease burden involving cerebral disease, faint T10 lesion, and right cerebral peduncle lesion.  She has elected not to be on disease-modifying therapies.  She had fluctuating neurological symptoms throughout the year, however has not had any MRI positive lesions since 2018.  Today, she reports left blurred vision, no loss of vision. She does not have vision loss of pain with eye movements typiacal for optic neuritis, so this is unlikely.  I do want her to get a second opinion with ophthalmologist for their opinion.  She is also due to annueal surveillance imaging with MRI brain wwo contrast.   Bilateral hand weakness.  Will continue to monitor. NCS/EMG of the arm, if symptoms get worse  Return to clinic in 1 year or sooner as needed  ----------------------------------------------------- History of present illness: She has history of transient bilateral vision loss, double vision, generalized fatigue, numbness/tingling of the arms > legs, and muscle aches since the late 1990s. All of her symptoms are fluctuating and occur for several days at a time about 3-4 times per year.  In July 2014, she says she had an episode of bilateral leg numbness and was unable to lift her legs. She did not go to the ED and had out-patient MRI brain which showed white matter changes involving the cerebral hemisphere and upper cervical cord, concerning for demyelinating disease.  No active disease.  CSF testing was essentially normal, except elevated CSF glucose.  Particularly, there  was no signs of inflammation.  MRI brain performed on 07/29/2014 showed one new right cerebral peduncle lesion without enhancement. Over the years, she has continued to have spells of numbness/tingling of the arms and legs and fatigue.  In 2018, MRI thoracic spine which shows 1 cm area of cord hyperintensity at T10 without enhancement.  No prior imaging to compare with, so it is difficult to determine the age of this lesion.  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  UPDATE 12/14/2020:  She is here for follow-up visit.  She continues to have intermittent numbness/tingling of the arms and legs.  Sometimes she has generalized fatigue and imbalance. She has not suffered any falls.  No focal weakness.  She feels that her legs get more numb and would like to have imaging to monitor her MS.  Her last imaging was in 2020.   UPDATE 07/13/2021:  She is here for follow-up visit.  MRI brain, cervical spine and thoracic spine from the fall of 2022 was reviewed and does not show any new MS lesions. Over the past month, began feeling more sluggish and tired on the left side. She has heaviness and tingling over the left arm, leg, and left eyelid.  Symptoms are constant.  UPDATE 06/27/2022:  She was having problems with the left eye in December.  She complains of blurred vision and heaviness of the eye.  She denies pain with eye movements or vision loss. She saw optometrist who said she needed new eyeglasses.  She had noticed a new bump/swelling over the left eye and was told it was chalazion. She also reports having difficulty  opening jars and bottles.  No ongoing numbness/tingling or weakness.  Medications:  Current Outpatient Medications on File Prior to Visit  Medication Sig Dispense Refill   aspirin EC 81 MG tablet Take 1 tablet (81 mg total) by mouth daily. Swallow whole. 90 tablet 3   Cholecalciferol (VITAMIN D) 2000 UNITS tablet Take 2,000 Units by mouth daily.     Collagen-Vitamin C-Biotin  (COLLAGEN 1500/C PO) Take by mouth.     magnesium 30 MG tablet Take 30 mg by mouth 2 (two) times daily.     metFORMIN (GLUMETZA) 1000 MG (MOD) 24 hr tablet Take 1,000 mg by mouth daily with breakfast.     metoprolol succinate (TOPROL-XL) 50 MG 24 hr tablet Take 50 mg by mouth daily.     metoprolol tartrate (LOPRESSOR) 100 MG tablet Take 1 tablet (100 mg total) by mouth once for 1 dose. Take 2 hours prior to your CT scan. 1 tablet 0   Multiple Vitamins-Minerals (QC WOMENS DAILY MULTIVITAMIN PO) Take by mouth daily.      Omega-3 Fatty Acids (FISH OIL) 1000 MG CAPS Take by mouth.     Zinc Sulfate (ZINC 15 PO) Take by mouth.     atorvastatin (LIPITOR) 40 MG tablet Take 1 tablet (40 mg total) by mouth daily. (Patient not taking: Reported on 06/27/2022) 30 tablet 5   No current facility-administered medications on file prior to visit.    Allergies:  Allergies  Allergen Reactions   Banana Anaphylaxis   Cantaloupe Extract Allergy Skin Test Anaphylaxis   Cherry Anaphylaxis   Citrullus Vulgaris Anaphylaxis   Red Dye    Latex Itching   Sulfa Antibiotics Itching     Vital Signs:  BP 117/74   Pulse 74   Ht '5\' 5"'$  (1.651 m)   Wt 195 lb (88.5 kg)   SpO2 98%   BMI 32.45 kg/m    Neurological Exam: MENTAL STATUS including orientation to time, place, person, recent and remote memory, attention span and concentration, language, and fund of knowledge is normal.  Speech is not dysarthric.  CRANIAL NERVES:  Unremarkable funduscopic exam. Pupils equal round and reactive to light.  Normal conjugate, extra-ocular eye movements in all directions of gaze.  No ptosis.  Face is symmetric. Palate elevates symmetrically.  Tongue is midline.  MOTOR:  Motor strength is 5/5 in all extremities.  No pronator drift.  Tone is normal.    MSRs:  Reflexes are 2+/4 throughout.  SENSORY:  Intact to vibration throughout.    COORDINATION/GAIT:  Normal finger-to- nose-finger.  Intact rapid alternating movements  bilaterally.  Gait narrow based and stable.  Mild unsteadiness with stressed and tandem gait, but able to perform.    Data: MRI brain wwo contrast 09/05/2013: Multiple foci of white matter T2 signal abnormality in both cerebral hemispheres and in the upper cervical spinal cord. Findings are nonspecific but most concerning for demyelinating disease. No abnormal enhancement to suggest active demyelination.   MRI cervical spine wwo contrast 09/14/2013: 1. Cervical MRI fails to confirm definite abnormal signal or enhancement within the cervical cord.  2. Small disc protrusions at C5-6 and C6-7 without cord deformity or foraminal compromise.  Labs 08/08/2013: WBC 7.7, Hgb 14.2, PLT 220, Na 140, K 4.6, Cr 0.66, AST 25, ALT 43*, TSH 1.6, ESR 3, vitamin B12 553, ANA neg  CSF 02/20/2014:  R0 W3 G107* P26  IgG 1.3, oligoclonal bands absent, ACE 7, cytology negative  MRI brain and cervical spine wwo contrast 07/29/2014:   1.  Question new 4 mm T2 lesion within the right cerebral peduncle.  This could represent progression of a demyelinating process. 2. Subcortical T2 hyperintensities are otherwise stable. 3. Stable left parietal occipital infarct. 4. Minimal spondylosis of the cervical spine without change. There is no significant cord signal abnormality within the cervical spine.  MRI brain and cervical wwo contrast 09/28/2015: 1. 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 No abnormal enhancement to suggest active demyelination. 2. No cervical spinal cord lesions identified. 3. Slightly increased size of C5-6 disc protrusion without stenosis.   MRI thoracic spine wwo contrast 10/19/2016: 1 cm area of cord hyperintensity at T10 without enhancement. This is most consistent with multiple sclerosis. No other cord lesions Moderate disc degeneration at T11-12 without spinal stenosis.    MRI thoracic spine 02/09/2021: Unchanged short-segment T2 hyperintense lesion within the dorsal aspect of the  spinal cord at the T10 level. No new focus of signal abnormality is identified within the thoracic spinal cord. No abnormal cord enhancement to suggest active demyelination. Thoracic spondylosis, as outlined and unchanged. No significant spinal canal or foraminal stenosis.   MRI brain 08/06/2021: 1. No evidence of acute intracranial abnormality. 2. Redemonstrated chronic cortical/subcortical infarct at the junction of the left parietal, temporal and occipital lobes.  3. Stable and nonspecific multifocal T2 FLAIR hyperintense signal abnormality elsewhere within the cerebral white matter. 4. Multiple small chronic infarcts within the bilateral cerebellar hemispheres also appear unchanged. 5. Mild right frontal sinusitis.   MRI cervical spine 12/16/2021: 1. No evidence of demyelinating plaque in the cervical. 2. Mild degenerative changes of the cervical spine, as detailed above, stable when compared to prior MRI. No high-grade spinal canal or neural foraminal stenosis at any level.      Thank you for allowing me to participate in patient's care.  If I can answer any additional questions, I would be pleased to do so.    Sincerely,    Danese Dorsainvil K. Posey Pronto, DO

## 2022-06-27 ENCOUNTER — Ambulatory Visit: Payer: Medicaid Other | Admitting: Neurology

## 2022-06-27 ENCOUNTER — Encounter: Payer: Self-pay | Admitting: Neurology

## 2022-06-27 VITALS — BP 117/74 | HR 74 | Ht 65.0 in | Wt 195.0 lb

## 2022-06-27 DIAGNOSIS — G35 Multiple sclerosis: Secondary | ICD-10-CM | POA: Diagnosis not present

## 2022-06-27 DIAGNOSIS — H538 Other visual disturbances: Secondary | ICD-10-CM

## 2022-06-27 NOTE — Patient Instructions (Addendum)
Recommend seeing ophthalmologist for left eye symptoms  MRI brain   Return to clinic 1 year

## 2022-06-29 ENCOUNTER — Ambulatory Visit: Payer: Medicaid Other | Attending: Neurology | Admitting: Physical Therapy

## 2022-06-29 DIAGNOSIS — G35 Multiple sclerosis: Secondary | ICD-10-CM | POA: Insufficient documentation

## 2022-06-29 DIAGNOSIS — M6281 Muscle weakness (generalized): Secondary | ICD-10-CM | POA: Diagnosis present

## 2022-07-02 NOTE — Therapy (Signed)
[facility] Physical Therapy [address] [place], [place], [address] Phone: [phone]   Fax:  [phone]  Physical Therapy Functional Capacity Evaluation  Patient Details  Name: Stephanie Webb MRN: [id number] Date of Birth: 1965-08-28  Encounter Date: 06/29/2022   PT End of Session - 07/02/22 1919     Visit Number 1    Number of Visits 1    Date for PT Re-Evaluation 06/30/22    PT Start Time 1004    PT Stop Time 1138    PT Time Calculation (min) 94 min             Past Medical History:  Diagnosis Date   Anxiety    Cervical cancer ([place]) 1992   Diabetes mellitus without complication ([place])    Diverticulitis    GERD (gastroesophageal reflux disease)    Heart murmur    Hyperlipidemia    Hypertension    takes Metoprolol for irregular heart beat   Irregular heart beat    takes Metoprolol for this   Irritable bowel syndrome    Multiple sclerosis ([place])    Pneumonia 2008   Status post dilation of esophageal narrowing     Past Surgical History:  Procedure Laterality Date   APPENDECTOMY     CESAREAN SECTION     Ovary removal           Plan - 07/02/22 1920     Clinical Impression Statement Overall Level of Work: Falls within the Light range.  Exerting up to 20 pounds of force occasionally, and/or up to 10 pounds of force frequently, and/or a negligible amount of force constantly (Constantly: activity or condition exist 2/3 or more of the time) to move objects.  Physical demand requirements are in excess of those for Sedentary Work.  Even though the weight lifted may be only a negligible amount, a job should be rated Light Work: (1) when it requires walking or standing to significant degree; or (2) when it requires sitting most of the time but entails pushing and/or pulling of arm or leg controls; and/or (3) when the job requires working at a production rate pace entailing the constant pushing and/or pulling of materials even though the weight of  those materials is negligible.  NOTE: The constant stress and strain of maintaining a production rate pace, especially in an industrial setting, can be and is physically demanding of a worker even though the amount of force exerted is negligible.  Please note that the dynamic strength/manual materials handling section of the report indicates a higher level of work than that determined by considering the client's performance on the entire test.  Our research shows that the safe, overall level of work is significantly influenced by non-materials handling (i.e. position tolerance and mobility) abilities.  To ignore these non-materials handling demands, negatively impacts the validity of the test.    Please see the Task Performance Table for specific abilities.  Tolerance for the 8-Hour Day: Due to the client's limited ability to walk and stand, she would have to alternate between standing, walking and other tasks as listed in the task performance table to be able to tolerate the Light level of work for the 8-hour day/40-hour week.    PT Frequency One time visit    PT Treatment/Interventions Patient/family education;Neuromuscular re-education;Balance training;Functional mobility training    PT Next Visit Plan [facility] only             Patient will benefit from skilled therapeutic intervention in  order to improve the following deficits and impairments:     Visit Diagnosis: Multiple sclerosis, relapsing-remitting (HCC)  Muscle weakness (generalized)  Problem List Patient Active Problem List   Diagnosis Date Noted   Multiple sclerosis, relapsing-remitting ([place]) 08/17/2016   Esophageal reflux 03/05/2014   Irritable bowel syndrome 03/05/2014   Paresthesia 08/28/2013   Other malaise and fatigue 08/28/2013   Pura Spice, PT, DPT # [phone] 06/30/2022, 7:22 PM  [place] [facility] [address] [place], [place], [address] Phone: [phone]   Fax:  [phone]  Name:  Stephanie Webb MRN: [id number] Date of Birth: September 04, 1965

## 2022-08-25 ENCOUNTER — Ambulatory Visit: Payer: Medicaid Other | Admitting: Dermatology

## 2022-08-29 ENCOUNTER — Encounter: Payer: Self-pay | Admitting: Neurology

## 2022-08-30 ENCOUNTER — Ambulatory Visit
Admission: RE | Admit: 2022-08-30 | Discharge: 2022-08-30 | Disposition: A | Discharge status: Home / Self Care | Payer: Medicaid Other | Source: Ambulatory Visit | Attending: Neurology | Admitting: Neurology

## 2022-08-30 DIAGNOSIS — G35 Multiple sclerosis: Secondary | ICD-10-CM

## 2022-08-30 DIAGNOSIS — H538 Other visual disturbances: Secondary | ICD-10-CM

## 2022-08-30 MED ORDER — GADOPICLENOL 0.5 MMOL/ML IV SOLN
7.5000 mL | Freq: Once | INTRAVENOUS | Status: AC | PRN
  Administered 2022-08-30: 7.5 mL via INTRAVENOUS

## 2022-09-01 ENCOUNTER — Telehealth: Payer: Self-pay | Admitting: Neurology

## 2022-09-01 NOTE — Telephone Encounter (Signed)
Patient is returning a phone call.  

## 2022-09-01 NOTE — Telephone Encounter (Signed)
Pt called in returning Mahina's call about results °

## 2022-09-01 NOTE — Telephone Encounter (Signed)
See result note.  

## 2022-11-02 ENCOUNTER — Ambulatory Visit: Payer: Medicaid Other | Admitting: Dermatology

## 2023-02-01 ENCOUNTER — Ambulatory Visit: Payer: Medicaid Other | Admitting: Dermatology

## 2023-02-01 VITALS — BP 114/74 | HR 68

## 2023-02-01 DIAGNOSIS — L821 Other seborrheic keratosis: Secondary | ICD-10-CM | POA: Diagnosis not present

## 2023-02-01 DIAGNOSIS — L918 Other hypertrophic disorders of the skin: Secondary | ICD-10-CM

## 2023-02-01 DIAGNOSIS — Z1283 Encounter for screening for malignant neoplasm of skin: Secondary | ICD-10-CM | POA: Diagnosis not present

## 2023-02-01 DIAGNOSIS — W908XXA Exposure to other nonionizing radiation, initial encounter: Secondary | ICD-10-CM | POA: Diagnosis not present

## 2023-02-01 DIAGNOSIS — L700 Acne vulgaris: Secondary | ICD-10-CM

## 2023-02-01 DIAGNOSIS — L814 Other melanin hyperpigmentation: Secondary | ICD-10-CM

## 2023-02-01 DIAGNOSIS — L578 Other skin changes due to chronic exposure to nonionizing radiation: Secondary | ICD-10-CM | POA: Diagnosis not present

## 2023-02-01 DIAGNOSIS — D229 Melanocytic nevi, unspecified: Secondary | ICD-10-CM

## 2023-02-01 DIAGNOSIS — D225 Melanocytic nevi of trunk: Secondary | ICD-10-CM

## 2023-02-01 NOTE — Progress Notes (Signed)
New Patient Visit   Subjective  Stephanie Webb is a 57 y.o. female who presents for the following: Skin Cancer Screening and Full Body Skin Exam  The patient presents for Total-Body Skin Exam (TBSE) for skin cancer screening and mole check. The patient has spots, moles and lesions to be evaluated, some may be new or changing and the patient may have concern these could be cancer.  The following portions of the chart were reviewed this encounter and updated as appropriate: medications, allergies, medical history  Review of Systems:  No other skin or systemic complaints except as noted in HPI or Assessment and Plan.  Objective  Well appearing patient in no apparent distress; mood and affect are within normal limits.  A full examination was performed including scalp, head, eyes, ears, nose, lips, neck, chest, axillae, abdomen, back, buttocks, bilateral upper extremities, bilateral lower extremities, hands, feet, fingers, toes, fingernails, and toenails. All findings within normal limits unless otherwise noted below.   Relevant physical exam findings are noted in the Assessment and Plan.  Neck x 12 Fleshy, skin-colored pedunculated papules.    R zygoma x 1 Stuck-on, waxy, tan-brown papule or plaque --Discussed benign etiology and prognosis.        Assessment & Plan   SKIN CANCER SCREENING PERFORMED TODAY.  ACTINIC DAMAGE - Chronic condition, secondary to cumulative UV/sun exposure - diffuse scaly erythematous macules with underlying dyspigmentation - Recommend daily broad spectrum sunscreen SPF 30+ to sun-exposed areas, reapply every 2 hours as needed.  - Staying in the shade or wearing long sleeves, sun glasses (UVA+UVB protection) and wide brim hats (4-inch brim around the entire circumference of the hat) are also recommended for sun protection.  - Call for new or changing lesions.  LENTIGINES, SEBORRHEIC KERATOSES, HEMANGIOMAS - Benign normal skin lesions -  Benign-appearing - Call for any changes  MELANOCYTIC NEVI - Mammillated brown papule L inframammary 0.6 cm - Tan-brown and/or pink-flesh-colored symmetric macules and papules - Benign appearing on exam today - Observation - Call clinic for new or changing moles - Recommend daily use of broad spectrum spf 30+ sunscreen to sun-exposed areas.   Comedone of the face -Benign, no tx needed.  Skin tag Neck x 12  Symptomatic, irritating, patient would like treated.   Destruction of lesion - Neck x 12 Complexity: simple   Destruction method: cryotherapy   Informed consent: discussed and consent obtained   Timeout:  patient name, date of birth, surgical site, and procedure verified Lesion destroyed using liquid nitrogen: Yes   Region frozen until ice ball extended beyond lesion: Yes   Outcome: patient tolerated procedure well with no complications   Post-procedure details: wound care instructions given    Seborrheic keratosis R zygoma x 1  Discussed cosmetic lesion not covered by insurance and fee is $60 for first lesion and $15 for each lesion thereafter.  Destruction of lesion - R zygoma x 1 Complexity: simple   Destruction method: cryotherapy   Informed consent: discussed and consent obtained   Timeout:  patient name, date of birth, surgical site, and procedure verified Lesion destroyed using liquid nitrogen: Yes   Region frozen until ice ball extended beyond lesion: Yes   Outcome: patient tolerated procedure well with no complications   Post-procedure details: wound care instructions given     Return in about 1 year (around 02/01/2024) for TBSE.  Documentation: I have reviewed the above documentation for accuracy and completeness, and I agree with the above.  Armida Sans, MD

## 2023-02-01 NOTE — Patient Instructions (Signed)

## 2023-02-03 ENCOUNTER — Encounter: Payer: Self-pay | Admitting: Dermatology

## 2023-06-27 NOTE — Progress Notes (Deleted)
Follow-up Visit   Date: 06/27/23    Stephanie Webb MRN: [id number] DOB: [birth year]   Interim History: Stephanie Webb is a 58 y.o. right-handed Caucasian female with history of hypertension, GERD, irritable bowel syndrome, and palpitations returning to the clinic for follow-up of multiple sclerosis.     IMPRESSION: Relapsing-remitting multiple sclerosis, diagnosed 2015 with long history of various neurological symptoms since 1990s.  Disease burden involving cerebral disease, faint T10 lesion, and right cerebral peduncle lesion.  She has elected not to be on disease-modifying therapies.  She had fluctuating neurological symptoms throughout the year, however has not had any MRI positive lesions since 2018.  Today, she reports left blurred vision, no loss of vision. She does not have vision loss of pain with eye movements typiacal for optic neuritis, so this is unlikely.  I do want her to get a second opinion with ophthalmologist for their opinion.  She is also due to annueal surveillance imaging with MRI brain wwo contrast.   Bilateral hand weakness.  Will continue to monitor. NCS/EMG of the arm, if symptoms get worse  Return to clinic in 1 year or sooner as needed  ----------------------------------------------------- History of present illness: She has history of transient bilateral vision loss, double vision, generalized fatigue, numbness/tingling of the arms > legs, and muscle aches since the late 1990s. All of her symptoms are fluctuating and occur for several days at a time about 3-4 times per year.  In July 2014, she says she had an episode of bilateral leg numbness and was unable to lift her legs. She did not go to the ED and had out-patient MRI brain which showed white matter changes involving the cerebral hemisphere and upper cervical cord, concerning for demyelinating disease.  No active disease.  CSF testing was essentially normal, except elevated CSF glucose.  Particularly, there  was no signs of inflammation.  MRI brain performed on 07/29/2014 showed one new right cerebral peduncle lesion without enhancement. Over the years, she has continued to have spells of numbness/tingling of the arms and legs and fatigue.  In 2018, MRI thoracic spine which shows 1 cm area of cord hyperintensity at T10 without enhancement.  No prior imaging to compare with, so it is difficult to determine the age of this lesion.  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  UPDATE 12/14/2020:  She is here for follow-up visit.  She continues to have intermittent numbness/tingling of the arms and legs.  Sometimes she has generalized fatigue and imbalance. She has not suffered any falls.  No focal weakness.  She feels that her legs get more numb and would like to have imaging to monitor her MS.  Her last imaging was in 2020.   UPDATE 07/13/2021:  She is here for follow-up visit.  MRI brain, cervical spine and thoracic spine from the fall of 2022 was reviewed and does not show any new MS lesions. Over the past month, began feeling more sluggish and tired on the left side. She has heaviness and tingling over the left arm, leg, and left eyelid.  Symptoms are constant.  UPDATE 06/27/2022:  She was having problems with the left eye in December.  She complains of blurred vision and heaviness of the eye.  She denies pain with eye movements or vision loss. She saw optometrist who said she needed new eyeglasses.  She had noticed a new bump/swelling over the left eye and was told it was chalazion. She also reports having difficulty  opening jars and bottles.  No ongoing numbness/tingling or weakness. ***  Medications:  Current Outpatient Medications on File Prior to Visit  Medication Sig Dispense Refill   aspirin EC 81 MG tablet Take 1 tablet (81 mg total) by mouth daily. Swallow whole. 90 tablet 3   atorvastatin (LIPITOR) 40 MG tablet Take 1 tablet (40 mg total) by mouth daily. (Patient not taking: Reported on  06/27/2022) 30 tablet 5   Cholecalciferol (VITAMIN D) 2000 UNITS tablet Take 2,000 Units by mouth daily.     Collagen-Vitamin C-Biotin (COLLAGEN 1500/C PO) Take by mouth.     magnesium 30 MG tablet Take 30 mg by mouth 2 (two) times daily.     metFORMIN (GLUMETZA) 1000 MG (MOD) 24 hr tablet Take 1,000 mg by mouth daily with breakfast.     metoprolol succinate (TOPROL-XL) 50 MG 24 hr tablet Take 50 mg by mouth daily.     metoprolol tartrate (LOPRESSOR) 100 MG tablet Take 1 tablet (100 mg total) by mouth once for 1 dose. Take 2 hours prior to your CT scan. 1 tablet 0   Multiple Vitamins-Minerals (QC WOMENS DAILY MULTIVITAMIN PO) Take by mouth daily.  (Patient not taking: Reported on 02/01/2023)     Omega-3 Fatty Acids (FISH OIL) 1000 MG CAPS Take by mouth. (Patient not taking: Reported on 02/01/2023)     Zinc Sulfate (ZINC 15 PO) Take by mouth.     No current facility-administered medications on file prior to visit.    Allergies:  Allergies  Allergen Reactions   Banana Anaphylaxis   Cantaloupe Extract Allergy Skin Test Anaphylaxis   Cherry Anaphylaxis   Citrullus Vulgaris Anaphylaxis   Red Dye #40 (Allura Red)    Latex Itching   Sulfa Antibiotics Itching     Vital Signs:  There were no vitals taken for this visit.   Neurological Exam: MENTAL STATUS including orientation to time, place, person, recent and remote memory, attention span and concentration, language, and fund of knowledge is normal.  Speech is not dysarthric.  CRANIAL NERVES:  Unremarkable funduscopic exam. Pupils equal round and reactive to light.  Normal conjugate, extra-ocular eye movements in all directions of gaze.  No ptosis.  Face is symmetric. Palate elevates symmetrically.  Tongue is midline.  MOTOR:  Motor strength is 5/5 in all extremities.  No pronator drift.  Tone is normal.    MSRs:  Reflexes are 2+/4 throughout.  SENSORY:  Intact to vibration throughout.    COORDINATION/GAIT:  Normal finger-to-  nose-finger.  Intact rapid alternating movements bilaterally.  Gait narrow based and stable.  Mild unsteadiness with stressed and tandem gait, but able to perform.    Data: MRI brain wwo contrast 09/05/2013: Multiple foci of white matter T2 signal abnormality in both cerebral hemispheres and in the upper cervical spinal cord. Findings are nonspecific but most concerning for demyelinating disease. No abnormal enhancement to suggest active demyelination.   MRI cervical spine wwo contrast 09/14/2013: 1. Cervical MRI fails to confirm definite abnormal signal or enhancement within the cervical cord.  2. Small disc protrusions at C5-6 and C6-7 without cord deformity or foraminal compromise.  Labs 08/08/2013: WBC 7.7, Hgb 14.2, PLT 220, Na 140, K 4.6, Cr 0.66, AST 25, ALT 43*, TSH 1.6, ESR 3, vitamin B12 553, ANA neg  CSF 02/20/2014:  R0 W3 G107* P26  IgG 1.3, oligoclonal bands absent, ACE 7, cytology negative  MRI brain and cervical spine wwo contrast 07/29/2014:   1. Question new 4 mm T2 lesion  within the right cerebral peduncle.  This could represent progression of a demyelinating process. 2. Subcortical T2 hyperintensities are otherwise stable. 3. Stable left parietal occipital infarct. 4. Minimal spondylosis of the cervical spine without change. There is no significant cord signal abnormality within the cervical spine.  MRI brain and cervical wwo contrast 09/28/2015: 1. 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 No abnormal enhancement to suggest active demyelination. 2. No cervical spinal cord lesions identified. 3. Slightly increased size of C5-6 disc protrusion without stenosis.   MRI thoracic spine wwo contrast 10/19/2016: 1 cm area of cord hyperintensity at T10 without enhancement. This is most consistent with multiple sclerosis. No other cord lesions Moderate disc degeneration at T11-12 without spinal stenosis.    MRI thoracic spine 02/09/2021: Unchanged short-segment T2  hyperintense lesion within the dorsal aspect of the spinal cord at the T10 level. No new focus of signal abnormality is identified within the thoracic spinal cord. No abnormal cord enhancement to suggest active demyelination. Thoracic spondylosis, as outlined and unchanged. No significant spinal canal or foraminal stenosis.   MRI brain 08/06/2021: 1. No evidence of acute intracranial abnormality. 2. Redemonstrated chronic cortical/subcortical infarct at the junction of the left parietal, temporal and occipital lobes.  3. Stable and nonspecific multifocal T2 FLAIR hyperintense signal abnormality elsewhere within the cerebral white matter. 4. Multiple small chronic infarcts within the bilateral cerebellar hemispheres also appear unchanged. 5. Mild right frontal sinusitis.   MRI cervical spine 12/16/2021: 1. No evidence of demyelinating plaque in the cervical. 2. Mild degenerative changes of the cervical spine, as detailed above, stable when compared to prior MRI. No high-grade spinal canal or neural foraminal stenosis at any level.      Thank you for allowing me to participate in patient's care.  If I can answer any additional questions, I would be pleased to do so.    Sincerely,    Yasuko Lapage K. Allena Katz, DO

## 2023-06-28 ENCOUNTER — Encounter: Payer: Self-pay | Admitting: Neurology

## 2023-06-28 ENCOUNTER — Ambulatory Visit: Payer: Medicaid Other | Admitting: Neurology

## 2023-07-18 ENCOUNTER — Ambulatory Visit: Payer: Medicaid Other | Admitting: Neurology

## 2023-07-18 VITALS — BP 128/80 | HR 82 | Ht 65.0 in | Wt 205.0 lb

## 2023-07-18 DIAGNOSIS — G35 Multiple sclerosis: Secondary | ICD-10-CM | POA: Diagnosis not present

## 2023-07-18 NOTE — Progress Notes (Signed)
Follow-up Visit   Date: 07/18/23    Stephanie Webb MRN: [id number] DOB: 1965/09/01   Interim History: Stephanie Webb is a 58 y.o. right-handed Caucasian female with history of hypertension, GERD, irritable bowel syndrome, and palpitations returning to the clinic for follow-up of multiple sclerosis.     IMPRESSION: Relapsing-remitting multiple sclerosis, diagnosed 2015, with long history of various neurological symptoms since 1990s. Disease burden involving cerebral disease, faint T10 lesion, and right cerebral peduncle lesion.  She has elected not to be on disease-modifying therapies.  She had fluctuating neurological symptoms throughout the year, however has not had any MRI positive lesions since 2018.  She has been stable over the past year. No new neurological symptoms.  Annual surveillance imaging with MRI brain wwo contrast will be ordered.   Return to clinic in 1 year or sooner as needed  ----------------------------------------------------- History of present illness: She has history of transient bilateral vision loss, double vision, generalized fatigue, numbness/tingling of the arms > legs, and muscle aches since the late 1990s. All of her symptoms are fluctuating and occur for several days at a time about 3-4 times per year.  In July 2014, she says she had an episode of bilateral leg numbness and was unable to lift her legs. She did not go to the ED and had out-patient MRI brain which showed white matter changes involving the cerebral hemisphere and upper cervical cord, concerning for demyelinating disease.  No active disease.  CSF testing was essentially normal, except elevated CSF glucose.  Particularly, there was no signs of inflammation.  MRI brain performed on 07/29/2014 showed one new right cerebral peduncle lesion without enhancement. Over the years, she has continued to have spells of numbness/tingling of the arms and legs and fatigue.  In 2018, MRI thoracic spine which shows  1 cm area of cord hyperintensity at T10 without enhancement.  No prior imaging to compare with, so it is difficult to determine the age of this lesion.  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  UPDATE 06/27/2022:  She was having problems with the left eye in December.  She complains of blurred vision and heaviness of the eye.  She denies pain with eye movements or vision loss. She saw optometrist who said she needed new eyeglasses.  She had noticed a new bump/swelling over the left eye and was told it was chalazion. She also reports having difficulty opening jars and bottles.  No ongoing numbness/tingling or weakness.   UPDATE 07/18/2023:  She is here 1 year follow-up visit.  She continues to have transient spells of numbness/tingling throughout the body, worse on the left.  No new weakness or vision changes.  No falls over the past year. Overall, she has been doing well with no new symptoms.    Medications:  Current Outpatient Medications on File Prior to Visit  Medication Sig Dispense Refill   Cholecalciferol (VITAMIN D) 2000 UNITS tablet Take 2,000 Units by mouth daily.     Collagen-Vitamin C-Biotin (COLLAGEN 1500/C PO) Take by mouth.     magnesium 30 MG tablet Take 30 mg by mouth 2 (two) times daily.     metFORMIN (GLUCOPHAGE) 500 MG tablet Take 500 mg by mouth 2 (two) times daily with a meal.     metoprolol succinate (TOPROL-XL) 50 MG 24 hr tablet Take 50 mg by mouth daily.     Probiotic Product (PROBIOTIC-10 PO) Take by mouth.     Zinc Sulfate (ZINC 15 PO) Take  by mouth.     aspirin EC 81 MG tablet Take 1 tablet (81 mg total) by mouth daily. Swallow whole. (Patient not taking: Reported on 07/18/2023) 90 tablet 3   metoprolol tartrate (LOPRESSOR) 100 MG tablet Take 1 tablet (100 mg total) by mouth once for 1 dose. Take 2 hours prior to your CT scan. 1 tablet 0   No current facility-administered medications on file prior to visit.    Allergies:  Allergies  Allergen  Reactions   Banana Anaphylaxis   Cantaloupe Extract Allergy Skin Test Anaphylaxis   Cherry Anaphylaxis   Citrullus Vulgaris Anaphylaxis   Red Dye #40 (Allura Red)    Latex Itching   Sulfa Antibiotics Itching     Vital Signs:  BP 128/80 (BP Location: Left Arm, Patient Position: Sitting, Cuff Size: Normal)   Pulse 82   Ht 5\' 5"  (1.651 m)   Wt 205 lb (93 kg)   SpO2 96%   BMI 34.11 kg/m    Neurological Exam: MENTAL STATUS including orientation to time, place, person, recent and remote memory, attention span and concentration, language, and fund of knowledge is normal.  Speech is not dysarthric.  CRANIAL NERVES:  Pupils equal round and reactive to light.  Normal conjugate, extra-ocular eye movements in all directions of gaze.  No ptosis.  Face is symmetric.   MOTOR:  Motor strength is 5/5 in all extremities.  No pronator drift.  Tone is normal.    MSRs:  Reflexes are 2+/4 throughout.  SENSORY:  Intact to vibration throughout.    COORDINATION/GAIT:  Normal finger-to- nose-finger.  Intact rapid alternating movements bilaterally.  Gait is mildly antalgic due to right knee pain, stable, unassisted.    Data: MRI brain wwo contrast 09/05/2013: Multiple foci of white matter T2 signal abnormality in both cerebral hemispheres and in the upper cervical spinal cord. Findings are nonspecific but most concerning for demyelinating disease. No abnormal enhancement to suggest active demyelination.   Labs 08/08/2013: WBC 7.7, Hgb 14.2, PLT 220, Na 140, K 4.6, Cr 0.66, AST 25, ALT 43*, TSH 1.6, ESR 3, vitamin B12 553, ANA neg  CSF 02/20/2014:  R0 W3 G107* P26  IgG 1.3, oligoclonal bands absent, ACE 7, cytology negative  MRI brain and cervical spine wwo contrast 07/29/2014:   1. Question new 4 mm T2 lesion within the right cerebral peduncle.  This could represent progression of a demyelinating process. 2. Subcortical T2 hyperintensities are otherwise stable. 3. Stable left parietal occipital  infarct. 4. Minimal spondylosis of the cervical spine without change. There is no significant cord signal abnormality within the cervical spine.   MRI thoracic spine wwo contrast 10/19/2016: 1 cm area of cord hyperintensity at T10 without enhancement. This is most consistent with multiple sclerosis. No other cord lesions Moderate disc degeneration at T11-12 without spinal stenosis.    MRI thoracic spine 02/09/2021: Unchanged short-segment T2 hyperintense lesion within the dorsal aspect of the spinal cord at the T10 level. No new focus of signal abnormality is identified within the thoracic spinal cord. No abnormal cord enhancement to suggest active demyelination. Thoracic spondylosis, as outlined and unchanged. No significant spinal canal or foraminal stenosis.   MRI brain wwo contrast 08/31/2022: 1. Similar scattered small T2 hyperintense foci in the white matter, with sparing of the corpus callosum; no new lesions. No abnormal parenchymal enhancement to suggest active demyelination. 2. Unchanged chronic encephalomalacia at the junction of the left temporal, parietal, and occipital lobes. 3. Unchanged enhancing extra-axial lesion along the medial  right frontal convexity, most likely a meningioma.   Thank you for allowing me to participate in patient's care.  If I can answer any additional questions, I would be pleased to do so.    Sincerely,    Skylen Danielsen K. Allena Katz, DO

## 2023-07-18 NOTE — Patient Instructions (Signed)
MRI brain w/wo contrast

## 2023-08-08 ENCOUNTER — Ambulatory Visit
Admission: RE | Admit: 2023-08-08 | Discharge: 2023-08-08 | Disposition: A | Discharge status: Home / Self Care | Payer: Medicaid Other | Source: Ambulatory Visit | Attending: Neurology | Admitting: Neurology

## 2023-08-08 DIAGNOSIS — G35 Multiple sclerosis: Secondary | ICD-10-CM

## 2023-08-08 MED ORDER — GADOPICLENOL 0.5 MMOL/ML IV SOLN
10.0000 mL | Freq: Once | INTRAVENOUS | Status: AC | PRN
  Administered 2023-08-08: 10 mL via INTRAVENOUS

## 2023-08-28 ENCOUNTER — Encounter: Payer: Self-pay | Admitting: Neurology

## 2023-11-06 ENCOUNTER — Encounter: Payer: Self-pay | Admitting: *Deleted

## 2023-11-09 ENCOUNTER — Ambulatory Visit: Attending: Cardiology | Admitting: Cardiology

## 2023-11-09 ENCOUNTER — Encounter: Payer: Self-pay | Admitting: Cardiology

## 2023-11-09 VITALS — BP 106/80 | HR 68 | Ht 65.0 in | Wt 200.2 lb

## 2023-11-09 DIAGNOSIS — I10 Essential (primary) hypertension: Secondary | ICD-10-CM | POA: Insufficient documentation

## 2023-11-09 DIAGNOSIS — E785 Hyperlipidemia, unspecified: Secondary | ICD-10-CM | POA: Insufficient documentation

## 2023-11-09 DIAGNOSIS — I2510 Atherosclerotic heart disease of native coronary artery without angina pectoris: Secondary | ICD-10-CM | POA: Insufficient documentation

## 2023-11-09 NOTE — Patient Instructions (Signed)
 Medication Instructions:  Your physician recommends that you continue on your current medications as directed. Please refer to the Current Medication list given to you today.   *If you need a refill on your cardiac medications before your next appointment, please call your pharmacy*  Lab Work: Your provider would like for you to have following labs drawn today lipid panel.   If you have labs (blood work) drawn today and your tests are completely normal, you will receive your results only by: MyChart Message (if you have MyChart) OR A paper copy in the mail If you have any lab test that is abnormal or we need to change your treatment, we will call you to review the results.  Testing/Procedures: No test ordered today   Follow-Up: At [facility], you and your health needs are our priority.  As part of our continuing mission to provide you with exceptional heart care, our providers are all part of one team.  This team includes your primary Cardiologist (physician) and Advanced Practice Providers or APPs (Physician Assistants and Nurse Practitioners) who all work together to provide you with the care you need, when you need it.  Your next appointment:   4 month(s)  Provider:   You may see Constancia Delton, MD or one of the following Advanced Practice Providers on your designated Care Team:   Laneta Pintos, NP Gildardo Labrador, PA-C Varney Gentleman, PA-C Cadence Lebanon, PA-C Ronald Cockayne, NP Morey Ar, NP    We recommend signing up for the patient portal called "MyChart".  Sign up information is provided on this After Visit Summary.  MyChart is used to connect with patients for Virtual Visits (Telemedicine).  Patients are able to view lab/test results, encounter notes, upcoming appointments, etc.  Non-urgent messages can be sent to your provider as well.   To learn more about what you can do with MyChart, go to [email or web address].

## 2023-11-09 NOTE — Progress Notes (Signed)
 Cardiology Office Note:    Date:  11/09/2023   ID:  Stephanie Webb, DOB [birth year], MRN [id number]  PCP:  Jim Motts, NP (Inactive)  [facility] Cardiologist:  Constancia Delton, MD  [facility] Electrophysiologist:  None   Referring MD: No ref. provider found   Chief Complaint  Patient presents with   Follow-up    OD 12 month f/u c/o chest discomfort. Meds reviewed verbally with pt.    History of Present Illness:    Stephanie Webb is a 58 y.o. 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    Last seen with symptoms of chest pain, coronary CT showed nonobstructive disease.  Advised to take aspirin , lipid panel also recommended.  States not taking aspirin  as prescribed.  Had an episode of chest discomfort yesterday in the context of indigestion.  Otherwise doing okay, no concerns at this time.  BP adequately controlled, compliant with Toprol -XL as prescribed.  Prior notes CCTA 02/2022 mild proximal OM1 stenosis 25%, minimal LAD stenosis<25%. Echo 04/2020 EF 60 to 65%, mild MR. Cardiac monitor 03/2020 no significant arrhythmias   Past Medical History:  Diagnosis Date   Anxiety    Cervical cancer (HCC) 1992   Diabetes mellitus without complication (HCC)    Diverticulitis    GERD (gastroesophageal reflux disease)    Heart murmur    Hyperlipidemia    Hypertension    takes Metoprolol  for irregular heart beat   Irregular heart beat    takes Metoprolol  for this   Irritable bowel syndrome    Multiple sclerosis (HCC)    Pneumonia 2008   Status post dilation of esophageal narrowing     Past Surgical History:  Procedure Laterality Date   APPENDECTOMY     CESAREAN SECTION     Ovary removal      Current Medications: Current Meds  Medication Sig   aspirin  EC 81 MG tablet Take 1 tablet (81 mg total) by mouth daily. Swallow whole.   Cholecalciferol  (VITAMIN D) 2000 UNITS tablet Take 2,000 Units by mouth daily.   Collagen-Vitamin C-Biotin (COLLAGEN 1500/C PO) Take by mouth.   magnesium 30 MG tablet Take 30 mg by mouth 2 (two) times daily.   metFORMIN (GLUCOPHAGE) 500 MG tablet Take 500 mg by mouth 2 (two) times daily with a meal.   metoprolol  succinate (TOPROL -XL) 50 MG 24 hr tablet Take 50 mg by mouth daily.   Zinc Sulfate (ZINC 15 PO) Take by mouth.     Allergies:   Banana, Cantaloupe extract allergy skin test, Cherry, Citrullus vulgaris, Red dye #40 (allura red), Latex, and Sulfa antibiotics   Social History   Socioeconomic History   Marital status: Married    Spouse name: Not on file   Number of children: 4   Years of education: 14   Highest education level: Not on file  Occupational History   Occupation: homemaker  Tobacco Use   Smoking status: Former    Current packs/day: 0.00    Types: Cigarettes    Start date: 06/06/1984    Quit date: 06/06/1994    Years since quitting: 29.4   Smokeless tobacco: Never  Vaping Use   Vaping status: Never Used  Substance and Sexual Activity   Alcohol use: Yes    Alcohol/week: 0.0 standard drinks of alcohol    Comment: Occasional    Drug use: Yes    Types: Marijuana  Comment: Occasional use, Recreational drug drug use in 1990s - marijuana, cocaine, opium, etc   Sexual activity: Yes    Birth control/protection: None  Other Topics Concern   Not on file  Social History Narrative      She lives with husband and two children. She has two grown children living outside of the home.   Highest level of education:  Associates degree   Right handed   Two story home   Social Drivers of Health   Financial Resource Strain: Not on file  Food Insecurity: Not on file  Transportation Needs: Not on file  Physical Activity: Not on file  Stress: Not on file  Social Connections: Not on file     Family History: The patient's family history includes Healthy in her brother; Heart attack (age  of onset: 83) in her maternal grandfather; Heart disease in her maternal grandfather; Hypertension in her mother; Lung cancer in her father. There is no history of Colon cancer, Colon polyps, Esophageal cancer, Diabetes, Rectal cancer, or Stomach cancer.  ROS:   Please see the history of present illness.     All other systems reviewed and are negative.  EKGs/Labs/Other Studies Reviewed:    The following studies were reviewed today:  EKG Interpretation Date/Time:  Thursday November 09 2023 09:42:15 EDT Ventricular Rate:  68 PR Interval:  150 QRS Duration:  88 QT Interval:  378 QTC Calculation: 401 R Axis:   6  Text Interpretation: Normal sinus rhythm Nonspecific ST and T wave abnormality Confirmed by Constancia Delton ([id number]) on 11/09/2023 9:54:51 AM    Recent Labs: No results found for requested labs within last 365 days.  Recent Lipid Panel No results found for: "CHOL", "TRIG", "HDL", "CHOLHDL", "VLDL", "LDLCALC", "LDLDIRECT"   Risk Assessment/Calculations:      Physical Exam:    VS:  BP 106/80 (BP Location: Left Arm, Patient Position: Sitting, Cuff Size: Normal)   Pulse 68   Ht 5\' 5"  (1.651 m)   Wt 200 lb 4 oz (90.8 kg)   SpO2 99%   BMI 33.32 kg/m     Wt Readings from Last 3 Encounters:  11/09/23 200 lb 4 oz (90.8 kg)  07/18/23 205 lb (93 kg)  06/27/22 195 lb (88.5 kg)     GEN:  Well nourished, well developed in no acute distress HEENT: Normal NECK: No JVD; No carotid bruits CARDIAC: RRR, no murmurs RESPIRATORY:  Clear to auscultation without rales, wheezing or rhonchi  ABDOMEN: Soft, non-tender, non-distended MUSCULOSKELETAL:  No edema; No deformity  SKIN: Warm and dry NEUROLOGIC:  Alert and oriented x 3 PSYCHIATRIC:  Normal affect   ASSESSMENT:    1. Coronary artery disease involving native coronary artery of native heart without angina pectoris   2. Primary hypertension   3. Hyperlipidemia LDL goal <70    PLAN:    In order of problems listed  above:  coronary CTA showed nonobstructive proximal OM1 disease 25%, minimal less than 25% proximal LAD stenosis.  Echo 04/2020 EF 60 to 65%.  Compliance with aspirin  81 mg daily advised.  Plan to start statin based on lipid panel results. hypertension, BP controlled.  Continue Toprol -XL 50 mg daily LDL goal less than 70, obtain lipid panel today.  Follow-up in 4-5 year.  Medication Adjustments/Labs and Tests Ordered: Current medicines are reviewed at length with the patient today.  Concerns regarding medicines are outlined above.  Orders Placed This Encounter  Procedures   Lipid panel   EKG 12-Lead  No orders of the defined types were placed in this encounter.   Patient Instructions  Medication Instructions:  Your physician recommends that you continue on your current medications as directed. Please refer to the Current Medication list given to you today.   *If you need a refill on your cardiac medications before your next appointment, please call your pharmacy*  Lab Work: Your provider would like for you to have following labs drawn today lipid panel.   If you have labs (blood work) drawn today and your tests are completely normal, you will receive your results only by: MyChart Message (if you have MyChart) OR A paper copy in the mail If you have any lab test that is abnormal or we need to change your treatment, we will call you to review the results.  Testing/Procedures: No test ordered today   Follow-Up: At [facility], you and your health needs are our priority.  As part of our continuing mission to provide you with exceptional heart care, our providers are all part of one team.  This team includes your primary Cardiologist (physician) and Advanced Practice Providers or APPs (Physician Assistants and Nurse Practitioners) who all work together to provide you with the care you need, when you need it.  Your next appointment:   4 month(s)  Provider:   You may see  Constancia Delton, MD or one of the following Advanced Practice Providers on your designated Care Team:   Laneta Pintos, NP Gildardo Labrador, PA-C Varney Gentleman, PA-C Cadence Hollins, PA-C Ronald Cockayne, NP Morey Ar, NP    We recommend signing up for the patient portal called "MyChart".  Sign up information is provided on this After Visit Summary.  MyChart is used to connect with patients for Virtual Visits (Telemedicine).  Patients are able to view lab/test results, encounter notes, upcoming appointments, etc.  Non-urgent messages can be sent to your provider as well.   To learn more about what you can do with MyChart, go to [email or web address].          Signed, Constancia Delton, MD  11/09/2023 12:06 PM    [facility]

## 2023-11-10 LAB — LIPID PANEL
Chol/HDL Ratio: 5.3 ratio — ABNORMAL HIGH (ref 0.0–4.4)
Cholesterol, Total: 260 mg/dL — ABNORMAL HIGH (ref 100–199)
HDL: 49 mg/dL (ref >39)
LDL Chol Calc (NIH): 178 mg/dL — ABNORMAL HIGH (ref 0–99)
Triglycerides: 178 mg/dL — ABNORMAL HIGH (ref 0–149)
VLDL Cholesterol Cal: 33 mg/dL (ref 5–40)

## 2023-11-17 ENCOUNTER — Ambulatory Visit: Payer: Self-pay | Admitting: Cardiology

## 2023-11-17 DIAGNOSIS — E785 Hyperlipidemia, unspecified: Secondary | ICD-10-CM

## 2023-11-17 MED ORDER — ATORVASTATIN CALCIUM 40 MG PO TABS: 40.0000 mg | ORAL_TABLET | Freq: Every day | ORAL | 3 refills | Status: AC

## 2023-11-17 MED ORDER — ROSUVASTATIN CALCIUM 20 MG PO TABS: 20.0000 mg | ORAL_TABLET | Freq: Every day | ORAL | 3 refills | Status: DC

## 2024-01-05 ENCOUNTER — Encounter: Payer: Self-pay | Admitting: Neurology

## 2024-02-07 ENCOUNTER — Ambulatory Visit: Payer: Medicaid Other | Admitting: Dermatology

## 2024-02-19 ENCOUNTER — Ambulatory Visit: Admitting: Dermatology

## 2024-03-01 ENCOUNTER — Ambulatory Visit: Attending: Cardiology | Admitting: Cardiology

## 2024-07-17 ENCOUNTER — Ambulatory Visit: Payer: Medicaid Other | Admitting: Neurology

## 2024-07-22 ENCOUNTER — Ambulatory Visit: Admitting: Neurology
